# Patient Record
Sex: Female | Born: 1980 | Race: White | Hispanic: No | Smoking: Never smoker
Health system: Southern US, Community
[De-identification: ages and names within clinical notes are randomized; demographics above are authoritative.]

## PROBLEM LIST (undated history)

## (undated) DIAGNOSIS — F419 Anxiety disorder, unspecified: Secondary | ICD-10-CM

## (undated) DIAGNOSIS — T7840XA Allergy, unspecified, initial encounter: Secondary | ICD-10-CM

## (undated) DIAGNOSIS — H7393 Unspecified disorder of tympanic membrane, bilateral: Secondary | ICD-10-CM

## (undated) DIAGNOSIS — T1491XA Suicide attempt, initial encounter: Secondary | ICD-10-CM

## (undated) DIAGNOSIS — F32A Depression, unspecified: Secondary | ICD-10-CM

## (undated) DIAGNOSIS — K219 Gastro-esophageal reflux disease without esophagitis: Secondary | ICD-10-CM

## (undated) DIAGNOSIS — F329 Major depressive disorder, single episode, unspecified: Secondary | ICD-10-CM

## (undated) DIAGNOSIS — R55 Syncope and collapse: Secondary | ICD-10-CM

## (undated) DIAGNOSIS — I10 Essential (primary) hypertension: Secondary | ICD-10-CM

## (undated) DIAGNOSIS — Z8739 Personal history of other diseases of the musculoskeletal system and connective tissue: Secondary | ICD-10-CM

## (undated) DIAGNOSIS — M461 Sacroiliitis, not elsewhere classified: Secondary | ICD-10-CM

## (undated) DIAGNOSIS — L509 Urticaria, unspecified: Secondary | ICD-10-CM

## (undated) DIAGNOSIS — J45909 Unspecified asthma, uncomplicated: Secondary | ICD-10-CM

## (undated) DIAGNOSIS — E162 Hypoglycemia, unspecified: Secondary | ICD-10-CM

## (undated) HISTORY — DX: Hypoglycemia, unspecified: E16.2

## (undated) HISTORY — DX: Gastro-esophageal reflux disease without esophagitis: K21.9

## (undated) HISTORY — DX: Sacroiliitis, not elsewhere classified: M46.1

## (undated) HISTORY — DX: Urticaria, unspecified: L50.9

## (undated) HISTORY — PX: WISDOM TOOTH EXTRACTION: SHX21

## (undated) HISTORY — DX: Syncope and collapse: R55

## (undated) HISTORY — DX: Unspecified disorder of tympanic membrane, bilateral: H73.93

## (undated) HISTORY — PX: ABDOMINAL HYSTERECTOMY: SHX81

## (undated) HISTORY — DX: Allergy, unspecified, initial encounter: T78.40XA

## (undated) HISTORY — DX: Essential (primary) hypertension: I10

## (undated) HISTORY — DX: Personal history of other diseases of the musculoskeletal system and connective tissue: Z87.39

---

## 2000-07-16 ENCOUNTER — Encounter: Admission: RE | Admit: 2000-07-16 | Discharge: 2000-07-16 | Payer: Self-pay

## 2003-10-07 ENCOUNTER — Emergency Department (HOSPITAL_COMMUNITY): Admission: EM | Admit: 2003-10-07 | Discharge: 2003-10-08 | Payer: Self-pay | Admitting: Emergency Medicine

## 2011-10-23 DIAGNOSIS — F419 Anxiety disorder, unspecified: Secondary | ICD-10-CM | POA: Insufficient documentation

## 2015-06-11 ENCOUNTER — Emergency Department (HOSPITAL_COMMUNITY)
Admission: EM | Admit: 2015-06-11 | Discharge: 2015-06-12 | Disposition: A | Discharge status: Other Acute Inpatient Hospital | Payer: Medicaid Other | Attending: Emergency Medicine | Admitting: Emergency Medicine

## 2015-06-11 ENCOUNTER — Ambulatory Visit (HOSPITAL_COMMUNITY)
Admission: RE | Admit: 2015-06-11 | Discharge: 2015-06-11 | Disposition: A | Discharge status: Home / Self Care | Payer: Medicaid Other | Attending: Psychiatry | Admitting: Psychiatry

## 2015-06-11 ENCOUNTER — Encounter (HOSPITAL_COMMUNITY): Payer: Self-pay | Admitting: Oncology

## 2015-06-11 DIAGNOSIS — Z79899 Other long term (current) drug therapy: Secondary | ICD-10-CM | POA: Diagnosis not present

## 2015-06-11 DIAGNOSIS — T50902A Poisoning by unspecified drugs, medicaments and biological substances, intentional self-harm, initial encounter: Secondary | ICD-10-CM | POA: Diagnosis not present

## 2015-06-11 DIAGNOSIS — Z888 Allergy status to other drugs, medicaments and biological substances status: Secondary | ICD-10-CM | POA: Insufficient documentation

## 2015-06-11 DIAGNOSIS — R45851 Suicidal ideations: Secondary | ICD-10-CM | POA: Insufficient documentation

## 2015-06-11 DIAGNOSIS — F329 Major depressive disorder, single episode, unspecified: Secondary | ICD-10-CM | POA: Diagnosis not present

## 2015-06-11 DIAGNOSIS — Z7722 Contact with and (suspected) exposure to environmental tobacco smoke (acute) (chronic): Secondary | ICD-10-CM | POA: Insufficient documentation

## 2015-06-11 DIAGNOSIS — Z9889 Other specified postprocedural states: Secondary | ICD-10-CM | POA: Insufficient documentation

## 2015-06-11 DIAGNOSIS — F419 Anxiety disorder, unspecified: Secondary | ICD-10-CM | POA: Diagnosis not present

## 2015-06-11 DIAGNOSIS — J45909 Unspecified asthma, uncomplicated: Secondary | ICD-10-CM | POA: Diagnosis not present

## 2015-06-11 DIAGNOSIS — R0902 Hypoxemia: Secondary | ICD-10-CM | POA: Insufficient documentation

## 2015-06-11 HISTORY — DX: Depression, unspecified: F32.A

## 2015-06-11 HISTORY — DX: Suicide attempt, initial encounter: T14.91XA

## 2015-06-11 HISTORY — DX: Anxiety disorder, unspecified: F41.9

## 2015-06-11 HISTORY — DX: Major depressive disorder, single episode, unspecified: F32.9

## 2015-06-11 HISTORY — DX: Unspecified asthma, uncomplicated: J45.909

## 2015-06-11 LAB — CBC
HCT: 37.5 % (ref 36.0–46.0)
Hemoglobin: 12.9 g/dL (ref 12.0–15.0)
MCH: 29.6 pg (ref 26.0–34.0)
MCHC: 34.4 g/dL (ref 30.0–36.0)
MCV: 86 fL (ref 78.0–100.0)
Platelets: 325 10*3/uL (ref 150–400)
RBC: 4.36 MIL/uL (ref 3.87–5.11)
RDW: 11.6 % (ref 11.5–15.5)
WBC: 11 10*3/uL — ABNORMAL HIGH (ref 4.0–10.5)

## 2015-06-11 LAB — COMPREHENSIVE METABOLIC PANEL
ALBUMIN: 4.3 g/dL (ref 3.5–5.0)
ALK PHOS: 67 U/L (ref 38–126)
ALT: 18 U/L (ref 14–54)
ANION GAP: 4 — AB (ref 5–15)
AST: 21 U/L (ref 15–41)
BILIRUBIN TOTAL: 0.4 mg/dL (ref 0.3–1.2)
BUN: 11 mg/dL (ref 6–20)
CALCIUM: 9.2 mg/dL (ref 8.9–10.3)
CO2: 27 mmol/L (ref 22–32)
Chloride: 109 mmol/L (ref 101–111)
Creatinine, Ser: 0.91 mg/dL (ref 0.44–1.00)
GFR calc Af Amer: >60 mL/min (ref >60)
GFR calc non Af Amer: >60 mL/min (ref >60)
GLUCOSE: 91 mg/dL (ref 65–99)
Potassium: 3.9 mmol/L (ref 3.5–5.1)
Sodium: 140 mmol/L (ref 135–145)
TOTAL PROTEIN: 7.7 g/dL (ref 6.5–8.1)

## 2015-06-11 LAB — SALICYLATE LEVEL: Salicylate Lvl: <4 mg/dL (ref 2.8–30.0)

## 2015-06-11 LAB — CBG MONITORING, ED: Glucose-Capillary: 78 mg/dL (ref 65–99)

## 2015-06-11 LAB — ETHANOL: Alcohol, Ethyl (B): <5 mg/dL (ref <5)

## 2015-06-11 LAB — ACETAMINOPHEN LEVEL: Acetaminophen (Tylenol), Serum: <10 ug/mL — ABNORMAL LOW (ref 10–30)

## 2015-06-11 MED ORDER — TETANUS-DIPHTH-ACELL PERTUSSIS 5-2.5-18.5 LF-MCG/0.5 IM SUSP
0.5000 mL | Freq: Once | INTRAMUSCULAR | Status: AC
  Administered 2015-06-11: 0.5 mL via INTRAMUSCULAR
  Filled 2015-06-11: qty 0.5

## 2015-06-11 NOTE — ED Provider Notes (Signed)
CSN: [id number]     Arrival date & time 06/11/15  1943 History   First MD Initiated Contact with Patient 06/11/15 2046     Chief Complaint  Patient presents with  . Ingestion  . Suicidal     (Consider location/radiation/quality/duration/timing/severity/associated sxs/prior Treatment) The history is provided by the patient and medical records. No language interpreter was used.     Tara Schmidt) is a 35 y.o. female  with a hx of Suicide attempt, depression, anxiety, asthma presents to the Emergency Department complaining of gradual, persistent, progressively worsening Suicidal ideations onset approximately 3 days ago. Patient's husband at bedside reports that she was cutting her wrists on Friday. She was evaluated by [facility] at that time and discharged home. He reports that yesterday she took a handful of clonazepam and was again evaluated by [facility] and discharged home. Today she continued to endorse suicidal ideation and took an additional handful of clonazepam as well as a handful of Vistaril. She states that she feels suicidal. Patient has been reports that she has been having custody problems with her ex-husband over her daughter and this seems to have prompted this episode of suicidal ideation. She has a long history of suicide attempt depression and anxiety. Patient has previously been hospitalized for same. Patient reports she feels sleepy but has no other somatic complaints. No nausea or vomiting, no chest pain or shortness of breath.     Past Medical History  Diagnosis Date  . Suicide attempt ([place])     x 3  . Depression   . Anxiety   . Asthma    Past Surgical History  Procedure Laterality Date  . Abdominal hysterectomy     No family history on file. Social History  Substance Use Topics  . Smoking status: Passive Smoke Exposure - Never Smoker  . Smokeless tobacco: Never Used  . Alcohol Use: Yes   OB History    No data available     Review of  Systems  Constitutional: Negative for fever, diaphoresis, appetite change, fatigue and unexpected weight change.  HENT: Negative for mouth sores.   Eyes: Negative for visual disturbance.  Respiratory: Negative for cough, chest tightness, shortness of breath and wheezing.   Cardiovascular: Negative for chest pain.  Gastrointestinal: Negative for nausea, vomiting, abdominal pain, diarrhea and constipation.  Endocrine: Negative for polydipsia, polyphagia and polyuria.  Genitourinary: Negative for dysuria, urgency, frequency and hematuria.  Musculoskeletal: Negative for back pain and neck stiffness.  Skin: Negative for rash.  Allergic/Immunologic: Negative for immunocompromised state.  Neurological: Negative for syncope, light-headedness and headaches.  Hematological: Does not bruise/bleed easily.  Psychiatric/Behavioral: Positive for suicidal ideas, self-injury and dysphoric mood. Negative for sleep disturbance. The patient is not nervous/anxious.       Allergies  Effexor; Latex; Zoloft; and Adhesive  Home Medications   Prior to Admission medications   Medication Sig Start Date End Date Taking? Authorizing Provider  albuterol (PROVENTIL HFA;VENTOLIN HFA) 108 (90 Base) MCG/ACT inhaler Inhale 1-2 puffs into the lungs every 6 (six) hours as needed for wheezing or shortness of breath.   Yes Historical Provider, MD  cetirizine (ZYRTEC) 10 MG tablet Take 10 mg by mouth daily.   Yes Historical Provider, MD  clonazePAM (KLONOPIN) 1 MG tablet Take 1 mg by mouth daily as needed for anxiety.    Yes Historical Provider, MD  hydrOXYzine (VISTARIL) 25 MG capsule Take 25 mg by mouth 3 (three) times daily as needed for anxiety.   Yes Historical  Provider, MD  hydrOXYzine (VISTARIL) 50 MG capsule Take 50 mg by mouth 3 (three) times daily as needed for anxiety.   Yes Historical Provider, MD  mometasone-formoterol (DULERA) 100-5 MCG/ACT AERO Inhale 1 puff into the lungs 2 (two) times daily.   Yes Historical  Provider, MD   BP 102/80 mmHg  Pulse 79  Temp(Src) 98.7 F (37.1 C) (Oral)  Resp 23  Ht 5\' 1"  (1.549 m)  Wt 60.328 kg  BMI 25.14 kg/m2  SpO2 97% Physical Exam  Constitutional: She appears well-developed and well-nourished. No distress.  Patient is drowsy but awakens to verbal stimuli  HENT:  Head: Normocephalic and atraumatic.  Mouth/Throat: Oropharynx is clear and moist. No oropharyngeal exudate.  Eyes: Conjunctivae are normal. No scleral icterus.  Neck: Normal range of motion. Neck supple.  Cardiovascular: Normal rate, regular rhythm, normal heart sounds and intact distal pulses.   No murmur heard. No tachycardia  Pulmonary/Chest: Effort normal and breath sounds normal. No respiratory distress. She has no wheezes.  Equal chest expansion  Abdominal: Soft. Bowel sounds are normal. She exhibits no mass. There is no tenderness. There is no rebound and no guarding.  Soft and nontender  Musculoskeletal: Normal range of motion. She exhibits no edema.  Neurological: She is alert.  Speech is clear and goal oriented Moves extremities without ataxia  Skin: Skin is warm and dry. She is not diaphoretic.  Psychiatric: She has a normal mood and affect.  Nursing note and vitals reviewed.   ED Course  Procedures (including critical care time) Labs Review Labs Reviewed  COMPREHENSIVE METABOLIC PANEL - Abnormal; Notable for the following:    Anion gap 4 (*)    All other components within normal limits  ACETAMINOPHEN LEVEL - Abnormal; Notable for the following:    Acetaminophen (Tylenol), Serum <10 (*)    All other components within normal limits  CBC - Abnormal; Notable for the following:    WBC 11.0 (*)    All other components within normal limits  ETHANOL  SALICYLATE LEVEL  URINE RAPID DRUG SCREEN, HOSP PERFORMED  URINALYSIS, ROUTINE W REFLEX MICROSCOPIC (NOT AT [facility])  CBG MONITORING, ED       EKG Interpretation   Date/Time:  Sunday June 11 2015 20:12:24 EDT Ventricular  Rate:  84 PR Interval:  138 QRS Duration: 83 QT Interval:  370 QTC Calculation: 437 R Axis:   55 Text Interpretation:  Sinus rhythm Normal QT interval, No delta waves No  previous ECGs available Confirmed by NGUYEN, EMILY ([id number]) on 06/11/2015  11:23:08 PM      EKG Interpretation  Date/Time:  Monday June 12 2015 00:06:49 EDT Ventricular Rate:  88 PR Interval:  129 QRS Duration: 82 QT Interval:  384 QTC Calculation: 465 R Axis:   46 Text Interpretation:  Sinus rhythm Normal QT interval, unchanged from previous ECG tonight      MDM   Final diagnoses:  Suicidal ideation  Overdose, intentional self-harm, initial encounter ([place])   Tara Schmidt) presents with suicidal ideations and suicide attempt by overdose. Discussed with poison control who recommends 4-6 hours of monitoring looking for tachycardia, QRS widening or seizures. Patient initially hypoxic in the low 90s and placed on 2 L of oxygen. Patient removed this herself and has had no further episodes of hypoxia. She remains alert to verbal stimuli.  12:12 AM Labs reassuring.  Repeat ECG without prolonged QT.  Pt is sleeping.  No further hypoxia.  Psych hold placed.    BP 102/80  mmHg  Pulse 79  Temp(Src) 98.7 F (37.1 C) (Oral)  Resp 23  Ht 5\' 1"  (1.549 m)  Wt 60.328 kg  BMI 25.14 kg/m2  SpO2 97%   Abigail Butts, PA-C 06/12/15 0022  Harvel Quale, MD 06/14/15 [phone]

## 2015-06-11 NOTE — ED Notes (Signed)
Per pt she took a "handful" of clonazepam last night and went to [facility] where she was d/c'd.  Pt reports her clonazepam is 1 mg and her vistaril is 50 mg.  Pt cannot tell this writer how many of each she had taken.

## 2015-06-11 NOTE — ED Notes (Signed)
Pt remains in sight of nurse's station.

## 2015-06-11 NOTE — BH Assessment (Signed)
Started to assess Pt. Pt and Pt's husband stated Pt had ingested an unknown quantity of Klonopin and Vistaril approximately forty five minutes prior to arriving at [facility]. Earlier today Pt superficially cut her wrist and tied a cord around her neck in a suicide attempt. Assessment was stopped, AC notified and 911 called. Contacted Anderson Malta, AC at [facility], and notified of situation.   Orpah Greek Anson Fret, LPC, [facility], [facility] Triage Specialist [phone]

## 2015-06-11 NOTE — ED Notes (Signed)
Pt wanded by security. 

## 2015-06-11 NOTE — ED Notes (Signed)
Per EMS pt intentionally took a handful of vistaril and clonazepam.  Total amount taken is unknown.  Pt reported to EMS that this was a suicide attempt.  Per EMS pt has superficial lacerations to left wrist as well.

## 2015-06-11 NOTE — ED Notes (Signed)
Per pt's husband pt has 2 bottles of clonazepam, one w/ 30 tablets and 1 w/ 20 tablets.  Pt's husband reports he believes he flushed approximately 15 tablets.

## 2015-06-11 NOTE — ED Notes (Signed)
Pt's belongings bagged, labeled and placed behind nurse's station. One bag of belongings (clothing, iphone, shoes) given to husband as per pt request.

## 2015-06-12 ENCOUNTER — Inpatient Hospital Stay (HOSPITAL_COMMUNITY)
Admission: AD | Admit: 2015-06-12 | Discharge: 2015-06-16 | DRG: 885 | Disposition: A | Discharge status: Home / Self Care | Payer: Medicaid Other | Source: Intra-hospital | Attending: Psychiatry | Admitting: Psychiatry

## 2015-06-12 ENCOUNTER — Encounter (HOSPITAL_COMMUNITY): Payer: Self-pay

## 2015-06-12 DIAGNOSIS — F332 Major depressive disorder, recurrent severe without psychotic features: Principal | ICD-10-CM | POA: Insufficient documentation

## 2015-06-12 DIAGNOSIS — Z915 Personal history of self-harm: Secondary | ICD-10-CM

## 2015-06-12 DIAGNOSIS — T424X2A Poisoning by benzodiazepines, intentional self-harm, initial encounter: Secondary | ICD-10-CM | POA: Diagnosis not present

## 2015-06-12 DIAGNOSIS — R45851 Suicidal ideations: Secondary | ICD-10-CM

## 2015-06-12 DIAGNOSIS — F329 Major depressive disorder, single episode, unspecified: Secondary | ICD-10-CM

## 2015-06-12 DIAGNOSIS — G4700 Insomnia, unspecified: Secondary | ICD-10-CM | POA: Diagnosis present

## 2015-06-12 DIAGNOSIS — T1491 Suicide attempt: Secondary | ICD-10-CM | POA: Diagnosis not present

## 2015-06-12 LAB — RAPID URINE DRUG SCREEN, HOSP PERFORMED
AMPHETAMINES: NOT DETECTED
BENZODIAZEPINES: POSITIVE — AB
Barbiturates: NOT DETECTED
Cocaine: NOT DETECTED
OPIATES: NOT DETECTED
Tetrahydrocannabinol: NOT DETECTED

## 2015-06-12 LAB — URINALYSIS, ROUTINE W REFLEX MICROSCOPIC
BILIRUBIN URINE: NEGATIVE
GLUCOSE, UA: NEGATIVE mg/dL
HGB URINE DIPSTICK: NEGATIVE
Ketones, ur: NEGATIVE mg/dL
Leukocytes, UA: NEGATIVE
Nitrite: NEGATIVE
PH: 6 (ref 5.0–8.0)
Protein, ur: NEGATIVE mg/dL
SPECIFIC GRAVITY, URINE: 1.025 (ref 1.005–1.030)

## 2015-06-12 MED ORDER — ONDANSETRON HCL 4 MG PO TABS: 4.0000 mg | ORAL_TABLET | Freq: Three times a day (TID) | ORAL | Status: DC | PRN

## 2015-06-12 MED ORDER — LOPERAMIDE HCL 2 MG PO CAPS
2.0000 mg | ORAL_CAPSULE | ORAL | Status: DC | PRN
  Administered 2015-06-12: 2 mg via ORAL
  Filled 2015-06-12: qty 1

## 2015-06-12 MED ORDER — LORAZEPAM 1 MG PO TABS
1.0000 mg | ORAL_TABLET | Freq: Four times a day (QID) | ORAL | Status: DC | PRN
  Administered 2015-06-12 – 2015-06-13 (×3): 1 mg via ORAL
  Filled 2015-06-12 (×3): qty 1

## 2015-06-12 MED ORDER — ZOLPIDEM TARTRATE 5 MG PO TABS: 5.0000 mg | ORAL_TABLET | Freq: Every evening | ORAL | Status: DC | PRN

## 2015-06-12 MED ORDER — LORAZEPAM 1 MG PO TABS: 1.0000 mg | ORAL_TABLET | Freq: Three times a day (TID) | ORAL | Status: DC | PRN

## 2015-06-12 MED ORDER — ALUM & MAG HYDROXIDE-SIMETH 200-200-20 MG/5ML PO SUSP: 30.0000 mL | ORAL | Status: DC | PRN

## 2015-06-12 MED ORDER — ALBUTEROL SULFATE HFA 108 (90 BASE) MCG/ACT IN AERS: 1.0000 | INHALATION_SPRAY | Freq: Four times a day (QID) | RESPIRATORY_TRACT | Status: DC | PRN

## 2015-06-12 MED ORDER — IBUPROFEN 200 MG PO TABS
600.0000 mg | ORAL_TABLET | Freq: Three times a day (TID) | ORAL | Status: DC | PRN
Start: 2015-06-12 — End: 2015-06-12
  Administered 2015-06-12: 600 mg via ORAL
  Filled 2015-06-12: qty 3

## 2015-06-12 MED ORDER — MAGNESIUM HYDROXIDE 400 MG/5ML PO SUSP: 30.0000 mL | Freq: Every day | ORAL | Status: DC | PRN

## 2015-06-12 MED ORDER — MOMETASONE FURO-FORMOTEROL FUM 100-5 MCG/ACT IN AERO
1.0000 | INHALATION_SPRAY | Freq: Two times a day (BID) | RESPIRATORY_TRACT | Status: DC
  Administered 2015-06-12: 1 via RESPIRATORY_TRACT
  Filled 2015-06-12: qty 8.8

## 2015-06-12 MED ORDER — LORATADINE 10 MG PO TABS
10.0000 mg | ORAL_TABLET | Freq: Every day | ORAL | Status: DC
  Administered 2015-06-12: 10 mg via ORAL
  Filled 2015-06-12: qty 1

## 2015-06-12 MED ORDER — ACETAMINOPHEN 325 MG PO TABS
650.0000 mg | ORAL_TABLET | Freq: Four times a day (QID) | ORAL | Status: DC | PRN
  Administered 2015-06-12 – 2015-06-15 (×2): 650 mg via ORAL
  Filled 2015-06-12 (×2): qty 2

## 2015-06-12 MED ORDER — NICOTINE 21 MG/24HR TD PT24: 21.0000 mg | MEDICATED_PATCH | Freq: Every day | TRANSDERMAL | Status: DC

## 2015-06-12 MED ORDER — PNEUMOCOCCAL VAC POLYVALENT 25 MCG/0.5ML IJ INJ
0.5000 mL | INJECTION | INTRAMUSCULAR | Status: AC
  Administered 2015-06-16: 0.5 mL via INTRAMUSCULAR

## 2015-06-12 MED ORDER — TRAZODONE HCL 50 MG PO TABS
50.0000 mg | ORAL_TABLET | Freq: Every evening | ORAL | Status: DC | PRN
  Administered 2015-06-12: 50 mg via ORAL
  Filled 2015-06-12 (×7): qty 1

## 2015-06-12 NOTE — Progress Notes (Signed)
D: Pt has depressed affect and depressed, anxious mood.  She reports her day "sucks."  Pt reports her goal is to "sleep."  Pt reports she had a good visit with her husband tonight.  Pt reports passive SI without a plan.  She verbally contracts for safety.  She denies HI, denies hallucinations, reports pain from headache of 8/10.  Pt complains of diarrhea.  Pt has been visible in milieu interacting with peers and staff appropriately.  Pt attended evening group.   A: Introduced self to pt.  Met with pt and offered support and encouragement.  Actively listened to pt.  On-site provider notified of pt's complaint of diarrhea.  PRN medication administered for diarrhea, pain, anxiety.  Medications administered per order.   R: Pt is compliant with medications.  Pt verbally contracts for safety.  Will continue to monitor and assess.

## 2015-06-12 NOTE — Consult Note (Signed)
[place] [facility] Psychiatry Consult   Reason for Consult:  Suicidal Attempt Referring Physician:  EPD Patient Identification: Tara Schmidt) MRN:  [id number] Principal Diagnosis: MDD (major depressive disorder) [facility]) Diagnosis:   Patient Active Problem List   Diagnosis Date Noted  . MDD (major depressive disorder) ([place]) [F32.9] 06/12/2015    Total Time spent with patient: 45 minutes  Subjective:   Tara Schmidt) is a 35 y.o. female patient admitted with suicidal attempt. Tara Schmidt) is awake, alert and oriented X3 , found resting in bedroom. Patient reports suicidal ideation with past attempts. Reports past dx of Bipolar and depression. Reports she is she followed by [facility] [place], however states she doesn't like to attend group theory/treatment due to social anxiety states that she doesn't like the people there. Patient validates information provided on tele-assessment. Denies homicidal ideation. Denies auditory or visual hallucination and does not appear to be responding to internal stimuli. States her depression 10/10. Support, encouragement and reassurance was provided.   HPI: PerTele- Assessment Note-Tara Schmidt) is an 35 y.o. female who presents to [facility] ED accompanied by her husband, who did not participate in assessment. Pt has a history of depression and anxiety. She reports she has been "very suicidal" for the past three days. She reports she superficially cut her wrists two days ago and was evaluated by staff at [facility] and discharged home. She reports yesterday she took a handful of clonazepam and again was evaluated and discharged home. She states today she took an unknown quantity of clonazepam and Vistaril in a suicide attempt. She was brought by her husband to [facility] for evaluation. Husband told [facility] staff that Pt also put a cord around her neck in an attempt to strangle herself. Pt was taken to [facility] via EMS for medical  clearance and further assessment.  Pt continues to endorse suicidal ideation. Pt reports symptoms including crying spells, social withdrawal, loss of interest in usual pleasures, fatigue, irritability, decreased concentration, decreased sleep, decreased appetite and feelings of guilt and hopelessness. Pt says she has difficulty sleeping even when she takes Vistaril. She reports one previous suicide attempt in 2014 when she overdosed on Xanax and was psychiatrically hospitalized at [facility]. She reports her anxiety "is through the roof" and reports daily panic attacks. She denies homicidal ideation or history of violence. She denies any history of psychotic symptoms. She denies abuse of alcohol or any substance abuse.  Pt identifies custody issues with her 77 year old daughter as her primary stressor. She states there is no legal custody agreement. She says her daughter went to stay with her father and wants to return to live with Pt but daughter's father is refusing to allow daughter to return. Pt lives with her husband and stepson. She has three sons, ages 64, 45 and 6, who live with their father. Pt says she would on an Alzheimer's unit and that her job can be stressful. Pt does not have a current psychiatrist or therapist and says she gets her clonopin and Vistaril form her primary care physician.  Past Psychiatric History: See Above  Risk to Self:   Risk to Others:   Prior Inpatient Therapy:   Prior Outpatient Therapy:    Past Medical History:  Past Medical History  Diagnosis Date  . Suicide attempt ([place])     x 3  . Depression   . Anxiety   . Asthma     Past Surgical History  Procedure  Laterality Date  . Abdominal hysterectomy     Family History: History reviewed. No pertinent family history. Family Psychiatric  History: See Above Social History:  History  Alcohol Use  . Yes     History  Drug Use No    Social History   Social History   . Marital Status: Married    Spouse Name: N/A  . Number of Children: N/A  . Years of Education: N/A   Social History Main Topics  . Smoking status: Passive Smoke Exposure - Never Smoker  . Smokeless tobacco: Never Used  . Alcohol Use: Yes  . Drug Use: No  . Sexual Activity: Yes    Birth Control/ Protection: Surgical   Other Topics Concern  . None   Social History Narrative   Additional Social History:    Allergies:   Allergies  Allergen Reactions  . Effexor [Venlafaxine] Hives  . Latex Hives  . Zoloft [Sertraline Hcl] Hives  . Adhesive [Tape] Hives and Rash    Labs:  Results for orders placed or performed during the hospital encounter of 06/11/15 (from the past 48 hour(s))  Comprehensive metabolic panel     Status: Abnormal   Collection Time: 06/11/15  8:27 PM  Result Value Ref Range   Sodium 140 135 - 145 mmol/L   Potassium 3.9 3.5 - 5.1 mmol/L   Chloride 109 101 - 111 mmol/L   CO2 27 22 - 32 mmol/L   Glucose, Bld 91 65 - 99 mg/dL   Schmidt 11 6 - 20 mg/dL   Creatinine, Ser 0.91 0.44 - 1.00 mg/dL   Calcium 9.2 8.9 - 10.3 mg/dL   Total Protein 7.7 6.5 - 8.1 g/dL   Albumin 4.3 3.5 - 5.0 g/dL   AST 21 15 - 41 U/L   ALT 18 14 - 54 U/L   Alkaline Phosphatase 67 38 - 126 U/L   Total Bilirubin 0.4 0.3 - 1.2 mg/dL   GFR calc non Af Amer >60 >60 mL/min   GFR calc Af Amer >60 >60 mL/min    Comment: (NOTE) The eGFR has been calculated using the CKD EPI equation. This calculation has not been validated in all clinical situations. eGFR's persistently <60 mL/min signify possible Chronic Kidney Disease.    Anion gap 4 (L) 5 - 15  Ethanol (ETOH)     Status: None   Collection Time: 06/11/15  8:27 PM  Result Value Ref Range   Alcohol, Ethyl (B) <5 <5 mg/dL    Comment:        LOWEST DETECTABLE LIMIT FOR SERUM ALCOHOL IS 5 mg/dL FOR MEDICAL PURPOSES ONLY   Salicylate level     Status: None   Collection Time: 06/11/15  8:27 PM  Result Value Ref Range   Salicylate Lvl  <9.0 2.8 - 30.0 mg/dL  Acetaminophen level     Status: Abnormal   Collection Time: 06/11/15  8:27 PM  Result Value Ref Range   Acetaminophen (Tylenol), Serum <10 (L) 10 - 30 ug/mL    Comment:        THERAPEUTIC CONCENTRATIONS VARY SIGNIFICANTLY. A RANGE OF 10-30 ug/mL MAY BE AN EFFECTIVE CONCENTRATION FOR MANY PATIENTS. HOWEVER, SOME ARE BEST TREATED AT CONCENTRATIONS OUTSIDE THIS RANGE. ACETAMINOPHEN CONCENTRATIONS >150 ug/mL AT 4 HOURS AFTER INGESTION AND >50 ug/mL AT 12 HOURS AFTER INGESTION ARE OFTEN ASSOCIATED WITH TOXIC REACTIONS.   CBC     Status: Abnormal   Collection Time: 06/11/15  8:27 PM  Result Value Ref Range  WBC 11.0 (H) 4.0 - 10.5 K/uL   RBC 4.36 3.87 - 5.11 MIL/uL   Hemoglobin 12.9 12.0 - 15.0 g/dL   HCT 37.5 36.0 - 46.0 %   MCV 86.0 78.0 - 100.0 fL   MCH 29.6 26.0 - 34.0 pg   MCHC 34.4 30.0 - 36.0 g/dL   RDW 11.6 11.5 - 15.5 %   Platelets 325 150 - 400 K/uL  CBG monitoring, ED     Status: None   Collection Time: 06/11/15  8:51 PM  Result Value Ref Range   Glucose-Capillary 78 65 - 99 mg/dL  Urine rapid drug screen (hosp performed) (Not at [facility])     Status: Abnormal   Collection Time: 06/12/15  1:21 AM  Result Value Ref Range   Opiates NONE DETECTED NONE DETECTED   Cocaine NONE DETECTED NONE DETECTED   Benzodiazepines POSITIVE (A) NONE DETECTED   Amphetamines NONE DETECTED NONE DETECTED   Tetrahydrocannabinol NONE DETECTED NONE DETECTED   Barbiturates NONE DETECTED NONE DETECTED    Comment:        DRUG SCREEN FOR MEDICAL PURPOSES ONLY.  IF CONFIRMATION IS NEEDED FOR ANY PURPOSE, NOTIFY LAB WITHIN 5 DAYS.        LOWEST DETECTABLE LIMITS FOR URINE DRUG SCREEN Drug Class       Cutoff (ng/mL) Amphetamine      1000 Barbiturate      200 Benzodiazepine   086 Tricyclics       578 Opiates          300 Cocaine          300 THC              50   Urinalysis, Routine w reflex microscopic (not at [facility])     Status: Abnormal   Collection Time:  06/12/15  1:21 AM  Result Value Ref Range   Color, Urine YELLOW YELLOW   APPearance CLOUDY (A) CLEAR   Specific Gravity, Urine 1.025 1.005 - 1.030   pH 6.0 5.0 - 8.0   Glucose, UA NEGATIVE NEGATIVE mg/dL   Hgb urine dipstick NEGATIVE NEGATIVE   Bilirubin Urine NEGATIVE NEGATIVE   Ketones, ur NEGATIVE NEGATIVE mg/dL   Protein, ur NEGATIVE NEGATIVE mg/dL   Nitrite NEGATIVE NEGATIVE   Leukocytes, UA NEGATIVE NEGATIVE    Comment: MICROSCOPIC NOT DONE ON URINES WITH NEGATIVE PROTEIN, BLOOD, LEUKOCYTES, NITRITE, OR GLUCOSE <1000 mg/dL.    Current Facility-Administered Medications  Medication Dose Route Frequency Provider Last Rate Last Dose  . acetaminophen (TYLENOL) tablet 650 mg  650 mg Oral Q6H PRN Derrill Center, NP      . alum & mag hydroxide-simeth (MAALOX/MYLANTA) 200-200-20 MG/5ML suspension 30 mL  30 mL Oral Q4H PRN Derrill Center, NP      . LORazepam (ATIVAN) tablet 1 mg  1 mg Oral Q6H PRN Kerrie Buffalo, NP      . magnesium hydroxide (MILK OF MAGNESIA) suspension 30 mL  30 mL Oral Daily PRN Derrill Center, NP        Musculoskeletal: Strength & Muscle Tone: within normal limits Gait & Station: normal Patient leans: N/A  Psychiatric Specialty Exam: Review of Systems  Psychiatric/Behavioral: Positive for depression, suicidal ideas and substance abuse. The patient is nervous/anxious and has insomnia.   All other systems reviewed and are negative.   Blood pressure 120/81, pulse 98, temperature 98.3 F (36.8 C), temperature source Oral, resp. rate 16, height 5' 0.5" (1.537 m), weight 58.968 kg (130 lb), last menstrual  period 02/19/2007, SpO2 100 %.Body mass index is 24.96 kg/(m^2).  General Appearance: Guarded  Eye Contact::  Fair  Speech:  Clear and Coherent  Volume:  Decreased  Mood:  Depressed  Affect:  Blunt, Depressed and Flat  Thought Process:  Intact  Orientation:  Full (Time, Place, and Person)  Thought Content:  Hallucinations: None  Suicidal Thoughts:  Yes.  with  intent/plan  Homicidal Thoughts:  No  Memory:  Immediate;   Fair Recent;   Fair Remote;   Fair  Judgement:  Intact  Insight:  Lacking  Psychomotor Activity:  Restlessness  Concentration:  Fair  Recall:  [employer] of Knowledge:Fair  Language: Good  Akathisia:  No  Handed:  Right  AIMS (if indicated):     Assets:  Desire for Improvement Resilience  ADL's:  Intact  Cognition: WNL  Sleep:        I agree with current treatment plan on 06/12/2015, Patient seen face-to-face for psychiatric evaluation follow-up, chart reviewed and case discussed with the MD Athira Janowicz, Advanced Practice Provider and Treatment team. Reviewed the information documented and agree with the treatment plan.   Treatment Plan Summary: Daily contact with patient to assess and evaluate symptoms and progress in treatment and Medication management Crisis stabilization- continue CIWAA/Ativan protocol     Disposition: Recommend psychiatric Inpatient admission when medically cleared. -Patient accepted to [address] Homans, NP 06/12/2015  Patient seen face-to-face for psychiatric evaluation, chart reviewed and case discussed with the physician extender and developed treatment plan. Reviewed the information documented and agree with the treatment plan. Corena Pilgrim, MD

## 2015-06-12 NOTE — Tx Team (Signed)
Initial Interdisciplinary Treatment Plan   PATIENT STRESSORS: Marital or family conflict Medication change or noncompliance   PATIENT STRENGTHS: Capable of independent living Communication skills Physical Health Supportive family/friends   PROBLEM LIST: Problem List/Patient Goals Date to be addressed Date deferred Reason deferred Estimated date of resolution  Medication stabilization 0000000     Family conflict 0000000     Depression 06/12/2015     Suicidal Ideation 06/12/2015                                    DISCHARGE CRITERIA:  Improved stabilization in mood, thinking, and/or behavior Motivation to continue treatment in a less acute level of care Need for constant or close observation no longer present Verbal commitment to aftercare and medication compliance  PRELIMINARY DISCHARGE PLAN: Outpatient therapy Return to previous living arrangement Return to previous work or school arrangements  PATIENT/FAMIILY INVOLVEMENT: This treatment plan has been presented to and reviewed with the patient, Tara Schmidt).  The patient and family have been given the opportunity to ask questions and make suggestions.  Tara Schmidt 06/12/2015, 5:58 PM

## 2015-06-12 NOTE — Progress Notes (Signed)
Adult Psychoeducational Group Note  Date:  06/12/2015 Time:  9:34 PM  Group Topic/Focus:  Wrap-Up Group:   The focus of this group is to help patients review their daily goal of treatment and discuss progress on daily workbooks.  Participation Level:  Active  Participation Quality:  Appropriate and Attentive  Affect:  Appropriate  Cognitive:  Appropriate  Insight: Appropriate and Good  Engagement in Group:  Engaged  Modes of Intervention:  Discussion  Additional Comments:  Pt goal is to get some rest and quit worrying so much.   Tara Schmidt 06/12/2015, 9:34 PM

## 2015-06-12 NOTE — Progress Notes (Signed)
Female visitor entered pt room as CM left out CM checked with ED RN about pt lunch tray as requested

## 2015-06-12 NOTE — ED Notes (Signed)
Bed: WA15 Expected date:  Expected time:  Means of arrival:  Comments: RES B 

## 2015-06-12 NOTE — Progress Notes (Signed)
Patient arrived on unit.  She requested a 72-hour discharge and was given the form to fill out.  Patient signed 4.24 at 16.  Explained process of 72 hour.  Patient was asked if she was suicidal and she stated, "I will be in here."  Asked patient if she could contract for safety and she stated, "there's nothing I can do in here."  Patient was given a dinner tray and she brought it up to nurses station.  She stated, "I don't feel like it's a good idea for me to have a fork" and then proceeded to show me superficial cuts bilateral arms.  Patient contracts for safety at this time.

## 2015-06-12 NOTE — ED Notes (Signed)
Pt gave this RN permission to speak w/ her husband.  Husband informed that Pt was moved to room 15 and verbalized understanding of [facility] Visitor Guidelines.

## 2015-06-12 NOTE — ED Notes (Signed)
Pt and husband informed that husband cannot be a part of intake process and visiting hours start at 6:30p.  Also, informed that clothing cannot have strings and personal products cannot have alcohol as one of the first 3 ingredients.  Pt and husband verbalized understanding.

## 2015-06-12 NOTE — Progress Notes (Signed)
Pt confirms with ED CM she sees Dortha Kern PA in [place] EPIC updated

## 2015-06-12 NOTE — BH Assessment (Addendum)
Tele Assessment Note   Tara Schmidt) is an 35 y.o. female who presents to [facility] ED accompanied by her Tara Schmidt, who did not participate in assessment. Tara Schmidt has a history of depression and anxiety. She reports she has been "very suicidal" for the past three days. She reports she superficially cut her wrists two days ago and was evaluated by Schmidt at [facility] and discharged home. She reports yesterday she took a handful of clonazepam and again was evaluated and discharged home. She states today she took an unknown quantity of clonazepam and Vistaril in a suicide attempt. She was brought by her Tara Schmidt to [facility] for evaluation. Tara Schmidt told Tara Schmidt that Tara Schmidt also put a cord around her neck in an attempt to strangle herself. Tara Schmidt was taken to [facility] via EMS for medical clearance and further assessment.  Tara Schmidt continues to endorse suicidal ideation. Tara Schmidt reports symptoms including crying spells, social withdrawal, loss of interest in usual pleasures, fatigue, irritability, decreased concentration, decreased sleep, decreased appetite and feelings of guilt and hopelessness. Tara Schmidt says she has difficulty sleeping even when she takes Vistaril. She reports one previous suicide attempt in 2014 when she overdosed on Xanax and was psychiatrically hospitalized at [facility]. She reports her anxiety "is through the roof" and reports daily panic attacks. She denies homicidal ideation or history of violence. She denies any history of psychotic symptoms. She denies abuse of alcohol or any substance abuse.  Tara Schmidt identifies custody issues with her 31 year old daughter as her primary stressor. She states there is no legal custody agreement. She says her daughter went to stay with her father and wants to return to live with Tara Schmidt but daughter's father is refusing to allow daughter to return. Tara Schmidt lives with her Tara Schmidt and stepson. She has three sons, ages 36, 8 and 70, who live with their  father. Tara Schmidt says she would on an Alzheimer's unit and that her job can be stressful. Tara Schmidt does not have a current psychiatrist or therapist and says she gets her clonopin and Vistaril form her primary care physician.  Tara Schmidt is dressed in hospital scrubs, drowsy, oriented x4 with soft, slow speech and normal motor behavior. Eye contact is fair. Tara Schmidt's mood is depressed and anxious; affect is depressed. Thought process is coherent and relevant. There is no indication Tara Schmidt is currently responding to internal stimuli or experiencing delusional thought content. Tara Schmidt was cooperative throughout assessment. She is willing to sign voluntarily into a psychiatric facility.   Diagnosis: Major Depressive Disorder, Recurrent, Severe Without Psychotic Features; Unspecified Anxiety Disorder  Past Medical History:  Past Medical History  Diagnosis Date  . Suicide attempt ([place])     x 3  . Depression   . Anxiety   . Asthma     Past Surgical History  Procedure Laterality Date  . Abdominal hysterectomy      Family History: No family history on file.  Social History:  reports that she has been passively smoking.  She has never used smokeless tobacco. She reports that she drinks alcohol. She reports that she does not use illicit drugs.  Additional Social History:  Alcohol / Drug Use Pain Medications: Denies abuse Prescriptions: Denies abuse Over the Counter: Denies abuse History of alcohol / drug use?: No history of alcohol / drug abuse Longest period of sobriety (when/how long): NA  CIWA: CIWA-Ar BP: 92/65 mmHg Pulse Rate: 92 COWS:    PATIENT STRENGTHS: (choose at least two) Ability for insight Average or above  average intelligence Capable of independent living Occupational psychologist fund of knowledge Motivation for treatment/growth Physical Health Supportive family/friends Work skills  Allergies:  Allergies  Allergen Reactions  . Effexor [Venlafaxine] Hives  . Latex Hives   . Zoloft [Sertraline Hcl] Hives  . Adhesive [Tape] Hives and Rash    Home Medications:  (Not in a hospital admission)  OB/GYN Status:  No LMP recorded. Patient has had a hysterectomy.  General Assessment Data Location of Assessment: [facility] TTS Assessment: In system Is this a Tele or Face-to-Face Assessment?: Tele Assessment Is this an Initial Assessment or a Re-assessment for this encounter?: Initial Assessment Marital status: Married [place] name: Raczka Is patient pregnant?: No Pregnancy Status: No Living Arrangements: Spouse/significant other, Children Can Tara Schmidt return to current living arrangement?: Yes Admission Status: Voluntary Is patient capable of signing voluntary admission?: Yes Referral Source: Self/Family/Friend Insurance type: Medicaid     Crisis Care Plan Living Arrangements: Spouse/significant other, Children Legal Guardian: Other: (None) Name of Psychiatrist: None Name of Therapist: None  Education Status Is patient currently in school?: No Current Grade: NA Highest grade of school patient has completed: GED Name of school: NA Contact person: NA  Risk to self with the past 6 months Suicidal Ideation: Yes-Currently Present Has patient been a risk to self within the past 6 months prior to admission? : Yes Suicidal Intent: Yes-Currently Present Has patient had any suicidal intent within the past 6 months prior to admission? : Yes Is patient at risk for suicide?: Yes Suicidal Plan?: Yes-Currently Present Has patient had any suicidal plan within the past 6 months prior to admission? : Yes Specify Current Suicidal Plan: Tara Schmidt overdosed, superficially cut wrists and wraped cord around her neck Access to Means: Yes Specify Access to Suicidal Means: Access to prescriptioin medications What has been your use of drugs/alcohol within the last 12 months?: Tara Schmidt denies abuse Previous Attempts/Gestures: Yes How many times?: 1 (Overdose in 2014) Other Self Harm Risks:  None Triggers for Past Attempts: Family contact Intentional Self Injurious Behavior: None Family Suicide History: No Recent stressful life event(s): Conflict (Comment) (Custody conflict regarding daughter) Persecutory voices/beliefs?: No Depression: Yes Depression Symptoms: Despondent, Insomnia, Tearfulness, Isolating, Fatigue, Guilt, Loss of interest in usual pleasures, Feeling worthless/self pity, Feeling angry/irritable Substance abuse history and/or treatment for substance abuse?: No Suicide prevention information given to non-admitted patients: Not applicable  Risk to Others within the past 6 months Homicidal Ideation: No Does patient have any lifetime risk of violence toward others beyond the six months prior to admission? : No Thoughts of Harm to Others: No Current Homicidal Intent: No Current Homicidal Plan: No Access to Homicidal Means: No Identified Victim: None History of harm to others?: No Assessment of Violence: None Noted Violent Behavior Description: Tara Schmidt denies history of violence Does patient have access to weapons?: No Criminal Charges Pending?: No Does patient have a court date: No Is patient on probation?: No  Psychosis Hallucinations: None noted Delusions: None noted  Mental Status Report Appearance/Hygiene: In scrubs Eye Contact: Good Motor Activity: Unremarkable Speech: Slow, Logical/coherent Level of Consciousness: Drowsy Mood: Depressed, Anxious Affect: Depressed Anxiety Level: Panic Attacks Panic attack frequency: Daily Most recent panic attack: Today Thought Processes: Coherent, Relevant Judgement: Partial Orientation: Person, Place, Time, Situation, Appropriate for developmental age Obsessive Compulsive Thoughts/Behaviors: None  Cognitive Functioning Concentration: Fair Memory: Recent Intact, Remote Intact IQ: Average Insight: Fair Impulse Control: Fair Appetite: Fair Weight Loss: 0 Weight Gain: 0 Sleep: Decreased Total Hours of  Sleep:  6 Vegetative Symptoms: None  ADLScreening [facility] Assessment Services) Patient's cognitive ability adequate to safely complete daily activities?: Yes Patient able to express need for assistance with ADLs?: Yes Independently performs ADLs?: Yes (appropriate for developmental age)  Prior Inpatient Therapy Prior Inpatient Therapy: Yes Prior Therapy Dates: 2014 Prior Therapy Facilty/Provider(s): [facility] Reason for Treatment: Suicide attempt  Prior Outpatient Therapy Prior Outpatient Therapy: No Prior Therapy Dates: NA Prior Therapy Facilty/Provider(s): NA Reason for Treatment: NA Does patient have an ACCT team?: No Does patient have Intensive In-House Services?  : No Does patient have [facility] services? : No Does patient have P4CC services?: No  ADL Screening (condition at time of admission) Patient's cognitive ability adequate to safely complete daily activities?: Yes Is the patient deaf or have difficulty hearing?: No Does the patient have difficulty seeing, even when wearing glasses/contacts?: No Does the patient have difficulty concentrating, remembering, or making decisions?: No Patient able to express need for assistance with ADLs?: Yes Does the patient have difficulty dressing or bathing?: No Independently performs ADLs?: Yes (appropriate for developmental age) Does the patient have difficulty walking or climbing stairs?: No Weakness of Legs: None Weakness of Arms/Hands: None  Home Assistive Devices/Equipment Home Assistive Devices/Equipment: None    Abuse/Neglect Assessment (Assessment to be complete while patient is alone) Physical Abuse: Denies Verbal Abuse: Denies Sexual Abuse: Denies Exploitation of patient/patient's resources: Denies Self-Neglect: Denies     Regulatory affairs officer (For Healthcare) Does patient have an advance directive?: No Would patient like information on creating an advanced directive?: No - patient declined information     Additional Information 1:1 In Past 12 Months?: No CIRT Risk: No Elopement Risk: No Does patient have medical clearance?: Yes     Disposition: Lavell Luster, AC at [facility], confirms adult unit is at capacity. Gave clinical report to Serena Colonel, NP who said Tara Schmidt meets criteria for inpatient psychiatric treatment. TTS will contact other facilities for placement. Notified Abigail Butts, PA-C and Anderson Malta, RN of recommendation.  Disposition Initial Assessment Completed for this Encounter: Yes Disposition of Patient: Inpatient treatment program Type of inpatient treatment program: Adult   Evelena Peat, [facility], [facility] Triage Specialist [phone]   Evelena Peat 06/12/2015 12:54 AM

## 2015-06-12 NOTE — ED Notes (Signed)
Psychiatrist and NP at bedside to assess

## 2015-06-13 ENCOUNTER — Encounter (HOSPITAL_COMMUNITY): Payer: Self-pay | Admitting: Psychiatry

## 2015-06-13 DIAGNOSIS — T1491 Suicide attempt: Secondary | ICD-10-CM

## 2015-06-13 DIAGNOSIS — T424X2A Poisoning by benzodiazepines, intentional self-harm, initial encounter: Secondary | ICD-10-CM

## 2015-06-13 DIAGNOSIS — F332 Major depressive disorder, recurrent severe without psychotic features: Principal | ICD-10-CM

## 2015-06-13 MED ORDER — HYDROXYZINE HCL 25 MG PO TABS
25.0000 mg | ORAL_TABLET | Freq: Three times a day (TID) | ORAL | Status: DC | PRN
  Administered 2015-06-13 – 2015-06-16 (×6): 25 mg via ORAL
  Filled 2015-06-13 (×11): qty 1

## 2015-06-13 MED ORDER — IBUPROFEN 600 MG PO TABS
ORAL_TABLET | ORAL | Status: AC
  Filled 2015-06-13: qty 1

## 2015-06-13 MED ORDER — MOMETASONE FURO-FORMOTEROL FUM 100-5 MCG/ACT IN AERO
1.0000 | INHALATION_SPRAY | Freq: Two times a day (BID) | RESPIRATORY_TRACT | Status: DC
  Administered 2015-06-13 – 2015-06-15 (×6): 1 via RESPIRATORY_TRACT
  Filled 2015-06-13 (×2): qty 8.8

## 2015-06-13 MED ORDER — FLUOXETINE HCL 20 MG PO CAPS
20.0000 mg | ORAL_CAPSULE | Freq: Every day | ORAL | Status: DC
  Administered 2015-06-13 – 2015-06-16 (×4): 20 mg via ORAL
  Filled 2015-06-13 (×7): qty 1

## 2015-06-13 MED ORDER — LORAZEPAM 1 MG PO TABS
1.0000 mg | ORAL_TABLET | Freq: Four times a day (QID) | ORAL | Status: DC | PRN
  Administered 2015-06-13 – 2015-06-15 (×5): 1 mg via ORAL
  Filled 2015-06-13 (×5): qty 1

## 2015-06-13 MED ORDER — IBUPROFEN 600 MG PO TABS
600.0000 mg | ORAL_TABLET | Freq: Four times a day (QID) | ORAL | Status: DC | PRN
  Administered 2015-06-13 – 2015-06-15 (×6): 600 mg via ORAL
  Filled 2015-06-13 (×5): qty 1

## 2015-06-13 MED ORDER — LORATADINE 10 MG PO TABS
10.0000 mg | ORAL_TABLET | Freq: Every day | ORAL | Status: DC
  Administered 2015-06-13: 10 mg via ORAL
  Filled 2015-06-13 (×4): qty 1

## 2015-06-13 MED ORDER — ALBUTEROL SULFATE HFA 108 (90 BASE) MCG/ACT IN AERS: 1.0000 | INHALATION_SPRAY | Freq: Four times a day (QID) | RESPIRATORY_TRACT | Status: DC | PRN

## 2015-06-13 MED ORDER — TRAZODONE HCL 50 MG PO TABS
50.0000 mg | ORAL_TABLET | Freq: Every day | ORAL | Status: DC
  Administered 2015-06-13 – 2015-06-14 (×2): 50 mg via ORAL
  Filled 2015-06-13 (×3): qty 1

## 2015-06-13 MED ORDER — QUETIAPINE FUMARATE 100 MG PO TABS
100.0000 mg | ORAL_TABLET | Freq: Every day | ORAL | Status: DC
  Administered 2015-06-13 – 2015-06-15 (×3): 100 mg via ORAL
  Filled 2015-06-13 (×4): qty 1
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
## 2015-06-13 NOTE — Plan of Care (Signed)
Problem: Diagnosis: Increased Risk For Suicide Attempt Goal: STG-Patient Will Attend All Groups On The Unit Outcome: Progressing Pt attended evening group on 06/12/15
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
## 2015-06-13 NOTE — Tx Team (Signed)
Interdisciplinary Treatment Plan Update (Adult) Date: 06/13/2015    Time Reviewed: 9:30 AM  Progress in Treatment: Attending groups: Continuing to assess, patient new to milieu Participating in groups: Continuing to assess, patient new to milieu Taking medication as prescribed: Yes Tolerating medication: Yes Family/Significant other contact made: No, CSW assessing for appropriate contacts Patient understands diagnosis: Yes Discussing patient identified problems/goals with staff: Yes Medical problems stabilized or resolved: Yes Denies suicidal/homicidal ideation: Yes Issues/concerns per patient self-inventory: Yes Other:  New problem(s) identified: N/A  Discharge Plan or Barriers: Home with outpatient services.   Reason for Continuation of Hospitalization:  Depression Anxiety Medication Stabilization   Comments: N/A  Estimated length of stay: 3-5 days    Patient is a 35 year old female who presented to the hospital with increased depression, anxiety, and suicide attempt by overdosing on prescription medications. Pt reports primary trigger(s) for admission was custody issues. Patient will benefit from crisis stabilization, medication evaluation, group therapy and psycho education in addition to case management for discharge planning. At discharge, it is recommended that Pt remain compliant with established discharge plan and continued treatment.   Review of initial/current patient goals per problem list:  1. Goal(s): Patient will participate in aftercare plan   Met: Yes   Target date: 3-5 days post admission date   As evidenced by: Patient will participate within aftercare plan AEB aftercare provider and housing plan at discharge being identified.  4/25: Goal met. Patient plans to return home to follow up with outpatient services.   2. Goal (s): Patient will exhibit decreased depressive symptoms and suicidal ideations.   Met: No   Target date: 3-5 days post  admission date   As evidenced by: Patient will utilize self rating of depression at 3 or below and demonstrate decreased signs of depression or be deemed stable for discharge by MD.   4/25: Goal not met: Pt presents with flat affect and depressed mood.  Pt admitted with depression rating of 10.  Pt to show decreased sign of depression and a rating of 3 or less before d/c.      3. Goal(s): Patient will demonstrate decreased signs and symptoms of anxiety.   Met: No   Target date: 3-5 days post admission date   As evidenced by: Patient will utilize self rating of anxiety at 3 or below and demonstrated decreased signs of anxiety, or be deemed stable for discharge by MD  4/25: Goal not met: Pt presents with anxious mood and affect.  Pt admitted with anxiety rating of 10.  Pt to show decreased sign of anxiety and a rating of 3 or less before d/c.     Attendees: Patient:    Family:    Physician: Dr. Parke Poisson; Dr. Sabra Heck 06/13/2015 9:30 AM  Nursing: Darrol Angel, Elesa Massed, RN 06/13/2015 9:30 AM  Clinical Social Worker: Tilden Fossa, LCSW 06/13/2015 9:30 AM  Other: Maxie Better, LCSW  06/13/2015 9:30 AM  Other:  06/13/2015 9:30 AM  Other: Lars Pinks, Case Manager 06/13/2015 9:30 AM  Other: Larose Kells, NP 06/13/2015 9:30 AM  Other:               Scribe for Treatment Team:  Tilden Fossa, [place]

## 2015-06-13 NOTE — Progress Notes (Signed)
Adult Psychoeducational Group Note  Date:  06/13/2015 Time:  9:21 PM  Group Topic/Focus:  Wrap-Up Group:   The focus of this group is to help patients review their daily goal of treatment and discuss progress on daily workbooks.  Participation Level:  Active  Participation Quality:  Appropriate and Attentive  Affect:  Appropriate  Cognitive:  Appropriate  Insight: Appropriate  Engagement in Group:  Engaged  Modes of Intervention:  Discussion  Additional Comments:  Pt sleep all day and her goal is to get more rest tonight.   Tara Schmidt 06/13/2015, 9:21 PM

## 2015-06-13 NOTE — Progress Notes (Signed)
Tara Schmidt is endorsing a headache 8/10. Anxiety 10/10 and Depression 6/10. She reports that she did not have a goal for today. Denies SI/HI/AVH. Encouragement and support given. Medications administered as prescribed. Continue Q 15 minute checks for patient safety and medication effectiveness.

## 2015-06-13 NOTE — BHH Suicide Risk Assessment (Signed)
[facility] Admission Suicide Risk Assessment   Nursing information obtained from:   patient and chart  Demographic factors:   35 year old employed female, married x 2  Current Mental Status:   see below  Loss Factors:   adolescent daughter no longer living with her, limited access to her children, who are currently with ex husband  Historical Factors:   depression  Risk Reduction Factors:   supportive husband, employed   Total Time spent with patient: 45 minutes Principal Problem:  MDD, recurrent, no psychotic features  Diagnosis:   Patient Active Problem List   Diagnosis Date Noted  . MDD (major depressive disorder) ([place]) [F32.9] 06/12/2015     Continued Clinical Symptoms:  Alcohol Use Disorder Identification Test Final Score (AUDIT): 0 The "Alcohol Use Disorders Identification Test", Guidelines for Use in Primary Care, Second Edition.  World Pharmacologist [facility]). Score between 0-7:  no or low risk or alcohol related problems. Score between 8-15:  moderate risk of alcohol related problems. Score between 16-19:  high risk of alcohol related problems. Score 20 or above:  warrants further diagnostic evaluation for alcohol dependence and treatment.   CLINICAL FACTORS:  35 year old female, status post suicidal attempt by overdosing on BZD and Antihystamine . She also recently cut her wrist- did not need sutures. States she has been struggling with depression, particularly as relates to her children. She is married x 2, and has three children who are currently living with ex husband . States that because teenaged daughter was threatening to run away from home with boyfriend, she sent her to live with the father for a period of time. She states the father ( her ex-husband ) is now refusing for daughter to return to patient, and also is keeping her from seeing her other children, who live with her . She states her overdose was impulsive in the context of  Phone arguments with her exhusband and  daughter as above. Describes neuro-vegetative symptoms of depression , such as anhedonia, sadness, suicidal ideations, changes in appetite and energy level . No psychotic symptoms. She has history of good response to Prozac and Seroquel in the past - denies having had side effects. Of note, reports history of having allergy/rash to Zoloft, but did not have any allergic reaction or side effect to Prozac . Dx- MDD, recurrent, without psychotic features, severe. Status post suicide attempt by overdosing . Plan- inpatient admission - Start Seroquel 100 mgrsd QHS , start Prozac 20 mgrs QDAY . Ativan PRNs for anxiety or potential WDL symptoms - reports she was taking Klonopin daily, but normally only one tablet per day.     Musculoskeletal: Strength & Muscle Tone: within normal limits- no tremors , no diaphoresis, no psychomotor restlessness  Gait & Station: normal Patient leans: N/A  Psychiatric Specialty Exam: ROS  Blood pressure 114/71, pulse 74, temperature 98 F (36.7 C), temperature source Oral, resp. rate 17, height 5' 0.5" (1.537 m), weight 130 lb (58.968 kg), last menstrual period 02/19/2007, SpO2 100 %.Body mass index is 24.96 kg/(m^2).  General Appearance: Fairly Groomed  Engineer, water::  Good  Speech:  Normal Rate  Volume:  Decreased  Mood:  Depressed  Affect:  Constricted  Thought Process:  Linear  Orientation:  Other:  fully alert and attentive   Thought Content:  denies hallucinations, no delusions, not internally preoccupied, ruminative about stressors as above   Suicidal Thoughts:  No- at this time denies any ongoing suicidal ideations, and denies any self injurious  ideations, contracts for safety on unit   Homicidal Thoughts:  No- specifically also denies any violent or homicidal ideation toward her ex husband   Memory:  recent and remote grossly intact   Judgement:  Fair  Insight:  Fair  Psychomotor Activity:  Normal  Concentration:  Good  Recall:  Good  Fund of  Knowledge:Good  Language: Negative  Akathisia:  NA  Handed:  Right  AIMS (if indicated):     Assets:  Desire for Improvement Resilience  Sleep:  Number of Hours: 6  Cognition: WNL  ADL's:  Intact    COGNITIVE FEATURES THAT CONTRIBUTE TO RISK:  Closed-mindedness and Loss of executive function    SUICIDE RISK:   Moderate:  Frequent suicidal ideation with limited intensity, and duration, some specificity in terms of plans, no associated intent, good self-control, limited dysphoria/symptomatology, some risk factors present, and identifiable protective factors, including available and accessible social support.  PLAN OF CARE: Patient will be admitted to inpatient psychiatric unit for stabilization and safety. Will provide and encourage milieu participation. Provide medication management and maked adjustments as needed.  Will follow daily.    I certify that inpatient services furnished can reasonably be expected to improve the patient's condition.   Neita Garnet, MD 06/13/2015, 1:05 PM

## 2015-06-13 NOTE — Progress Notes (Signed)
D- Patient has been attending groups, engaged in therapy patient denies SI, HI and AVH but reports increased anxiety for which she has used prn medication and help decrease.  Patient has been invested in treatment.   A-  Assess patient for safety, offer medications as prescribed engage patient in 1:1 staff talks.   R-  Patient able to contract for safety.

## 2015-06-14 DIAGNOSIS — F332 Major depressive disorder, recurrent severe without psychotic features: Secondary | ICD-10-CM | POA: Insufficient documentation

## 2015-06-14 MED ORDER — ENSURE ENLIVE PO LIQD
237.0000 mL | Freq: Two times a day (BID) | ORAL | Status: DC
  Administered 2015-06-14 – 2015-06-16 (×4): 237 mL via ORAL

## 2015-06-14 NOTE — Progress Notes (Signed)
Recreation Therapy Notes  Date: 04.26.2017 Time: 9:30am Location: [address] Dayroom   Group Topic: Stress Management  Goal Area(s) Addresses:  Patient will actively participate in stress management techniques presented during session.   Behavioral Response: Did not attend.    Laureen Ochs Sugar Vanzandt, LRT/CTRS        Dylann Gallier L 06/14/2015 2:24 PM

## 2015-06-14 NOTE — Progress Notes (Addendum)
[facility] MD Progress Note  06/14/2015 12:47 PM Tara Schmidt)  MRN:  [id number] Subjective:   Patient reports she is feeling " a little better". Continues to feel depressed, anxious, but to lesser degree than on admission. Denies medication side effects at this time . Objective : I have discussed case with treatment team and have met with patient . Patient remains depressed, anxious,constricted, but reports she is starting to feel better, less severely depressed . Remains ruminative about her family stressors, and not having access to her children at this time, who are now living with their father. Does state she had a phone conversation with daughter which " went all right", and that her husband came to visit, which helped her feel better as well . At this time does not endorse medication side effects. Denies suicidal ideations, contracts for safety on unit. Visible on unit. No disruptive or agitated behaviors. No current symptoms of BZD WDL- no tremors, no diaphoresis, no acute distress/restlessness  .  Principal Problem: MDD (major depressive disorder) ([place]) Diagnosis:   Patient Active Problem List   Diagnosis Date Noted  . MDD (major depressive disorder) ([place]) [F32.9] 06/12/2015   Total Time spent with patient: 20 minutes    Past Medical History:  Past Medical History  Diagnosis Date  . Suicide attempt ([place])     x 3  . Depression   . Anxiety   . Asthma     Past Surgical History  Procedure Laterality Date  . Abdominal hysterectomy     Family History: History reviewed. No pertinent family history.  Social History:  History  Alcohol Use  . Yes     History  Drug Use No    Social History   Social History  . Marital Status: Married    Spouse Name: N/A  . Number of Children: N/A  . Years of Education: N/A   Social History Main Topics  . Smoking status: Passive Smoke Exposure - Never Smoker  . Smokeless tobacco: Never Used  . Alcohol Use: Yes  . Drug Use: No  .  Sexual Activity: Yes    Birth Control/ Protection: Surgical   Other Topics Concern  . None   Social History Narrative   Additional Social History:   Sleep: improving   Appetite:  Fair  Current Medications: Current Facility-Administered Medications  Medication Dose Route Frequency Provider Last Rate Last Dose  . acetaminophen (TYLENOL) tablet 650 mg  650 mg Oral Q6H PRN Derrill Center, NP   650 mg at 06/12/15 2008  . albuterol (PROVENTIL HFA;VENTOLIN HFA) 108 (90 Base) MCG/ACT inhaler 1-2 puff  1-2 puff Inhalation Q6H PRN Encarnacion Slates, NP      . alum & mag hydroxide-simeth (MAALOX/MYLANTA) 200-200-20 MG/5ML suspension 30 mL  30 mL Oral Q4H PRN Derrill Center, NP      . FLUoxetine (PROZAC) capsule 20 mg  20 mg Oral Daily Jenne Campus, MD   20 mg at 06/14/15 0723  . hydrOXYzine (ATARAX/VISTARIL) tablet 25 mg  25 mg Oral TID PRN Encarnacion Slates, NP   25 mg at 06/14/15 1034  . ibuprofen (ADVIL,MOTRIN) tablet 600 mg  600 mg Oral Q6H PRN Laverle Hobby, PA-C   600 mg at 06/14/15 0725  . loperamide (IMODIUM) capsule 2 mg  2 mg Oral PRN Laverle Hobby, PA-C   2 mg at 06/12/15 2104  . LORazepam (ATIVAN) tablet 1 mg  1 mg Oral Q6H PRN Jenne Campus, MD  1 mg at 06/13/15 2001  . magnesium hydroxide (MILK OF MAGNESIA) suspension 30 mL  30 mL Oral Daily PRN Derrill Center, NP      . mometasone-formoterol (DULERA) 100-5 MCG/ACT inhaler 1 puff  1 puff Inhalation BID Encarnacion Slates, NP   1 puff at 06/14/15 0723  . pneumococcal 23 valent vaccine (PNU-IMMUNE) injection 0.5 mL  0.5 mL Intramuscular Tomorrow-1000 Willella Harding A Brainard Highfill, MD      . QUEtiapine (SEROQUEL) tablet 100 mg  100 mg Oral QHS Encarnacion Slates, NP   100 mg at 06/13/15 2130  . traZODone (DESYREL) tablet 50 mg  50 mg Oral QHS Encarnacion Slates, NP   50 mg at 06/13/15 2130    Lab Results: No results found for this or any previous visit (from the past 48 hour(s)).  Blood Alcohol level:  Lab Results  Component Value Date   ETH <5  06/11/2015    Physical Findings: AIMS: Facial and Oral Movements Muscles of Facial Expression: None, normal Lips and Perioral Area: None, normal Jaw: None, normal Tongue: None, normal,Extremity Movements Upper (arms, wrists, hands, fingers): None, normal Lower (legs, knees, ankles, toes): None, normal, Trunk Movements Neck, shoulders, hips: None, normal, Overall Severity Severity of abnormal movements (highest score from questions above): None, normal Incapacitation due to abnormal movements: None, normal Patient's awareness of abnormal movements (rate only patient's report): No Awareness, Dental Status Current problems with teeth and/or dentures?: No Does patient usually wear dentures?: No  CIWA:  CIWA-Ar Total: 0 COWS:     Musculoskeletal: Strength & Muscle Tone: within normal limits Gait & Station: normal Patient leans: N/A  Psychiatric Specialty Exam: ROS reports headache, denies chest pain, no shortness of breath, no vomiting   Blood pressure 105/64, pulse 75, temperature 98 F (36.7 C), temperature source Oral, resp. rate 16, height 5' 0.5" (1.537 m), weight 130 lb (58.968 kg), last menstrual period 02/19/2007, SpO2 100 %.Body mass index is 24.96 kg/(m^2).  General Appearance: Fairly Groomed  Engineer, water::  Good  Speech:  Normal Rate  Volume:  Normal  Mood:  depressed, but states she is feeling " a little better "  Affect:  still constricted, slightly tearful , but more reactive   Thought Process:  Linear  Orientation:  Full (Time, Place, and Person)  Thought Content:  denies hallucinations, no delusions, not internally preoccupied   Suicidal Thoughts:  No- denies any suicidal ideations at this time and contracts for safety on the unit   Homicidal Thoughts:  No- denies any homicidal ideations   Memory:  recent and remote grossly intact   Judgement:  Other:  improving   Insight:  improving   Psychomotor Activity:  Normal  Concentration:  Good  Recall:  Good  Fund of  Knowledge:Good  Language: Good  Akathisia:  Negative  Handed:  Right  AIMS (if indicated):     Assets:  Communication Skills Desire for Improvement Resilience  ADL's:  Intact  Cognition: WNL  Sleep:  Number of Hours: 6.75  Assessment - patient presents with partially improved mood, affect, but continues to report depression, remains ruminative about family stressors . Not suicidal , able to contract for safety at this time.  Treatment Plan Summary: Daily contact with patient to assess and evaluate symptoms and progress in treatment, Medication management, Plan inpatient treatment  and medications as below  Encourage group, milieu participation to work on coping skills and symptom reduction Continue  Prozac 20 mgrs QDAY for depression  Continue Seroquel  100 mgrs QHS for mood disorder, insomnia Continue Ativan 1 mg Q 6 hours PRN for anxiety  Continue Trazodone 50 mgrs QHS PRN for insomnia as needed  Treatment team working on disposition planning  Neita Garnet, MD 06/14/2015, 12:47 PM

## 2015-06-14 NOTE — Progress Notes (Signed)
D-  Patient has been complaining of increased anxiety this shift.  Patient reported that her anxiety was so bad that she could not eat due to an uneasy stomach.    A- Assess patient for safety, offer medications as prescribed engage patient in 1:1 staff talks  R-  Patient able to contract for safety.

## 2015-06-15 MED ORDER — LORAZEPAM 0.5 MG PO TABS: 0.5000 mg | ORAL_TABLET | Freq: Four times a day (QID) | ORAL | Status: AC | PRN

## 2015-06-15 NOTE — BHH Group Notes (Signed)
The focus of this group is to educate the patient on the purpose and policies of crisis stabilization and provide a format to answer questions about their admission.  The group details unit policies and expectations of patients while admitted.  Patient attended 0900 nurse education orientation group this morning.  Patient actively participated and had appropriate affect.  Patient is alert.  Patient had appropriate insight and appropriate engagement.  Today patient will work on 3 goals for discharge.  

## 2015-06-15 NOTE — Plan of Care (Signed)
Problem: Consults Goal: Depression Patient Education See Patient Education Module for education specifics.  Outcome: Progressing Nurse discussed depression/coping skills with patient.        

## 2015-06-15 NOTE — BHH Suicide Risk Assessment (Signed)
[place] INPATIENT:  Family/Significant Other Suicide Prevention Education  Suicide Prevention Education:  Education Completed; husband Tara Schmidt [phone],  (name of family member/significant other) has been identified by the patient as the family member/significant other with whom the patient will be residing, and identified as the person(s) who will aid the patient in the event of a mental health crisis (suicidal ideations/suicide attempt).  With written consent from the patient, the family member/significant other has been provided the following suicide prevention education, prior to the and/or following the discharge of the patient.  The suicide prevention education provided includes the following:  Suicide risk factors  Suicide prevention and interventions  National Suicide Hotline telephone number  [facility] assessment telephone number  [facility] Emergency Assistance [place] and/or Residential Mobile Crisis Unit telephone number  Request made of family/significant other to:  Remove weapons (e.g., guns, rifles, knives), all items previously/currently identified as safety concern.    Remove drugs/medications (over-the-counter, prescriptions, illicit drugs), all items previously/currently identified as a safety concern.  The family member/significant other verbalizes understanding of the suicide prevention education information provided.  The family member/significant other agrees to remove the items of safety concern listed above.  Tara Schmidt, Tara Schmidt 06/15/2015, 1:06 PM

## 2015-06-15 NOTE — BHH Group Notes (Signed)
Late Entry From 06/14/15 at 1:15pm:         [place] LCSW Group Therapy       Type of Therapy: Group Therapy  Participation Level: Did Not Attend. Patient invited to participate but declined.   Tilden Fossa, MSW, [place] Clinical Social Worker [facility] [phone]

## 2015-06-15 NOTE — Progress Notes (Signed)
Patient ID: Tara Schmidt, female   DOB: 1980/05/25, 35 y.o.   MRN: [id number] [facility] MD Progress Note  06/15/2015 12:20 PM Alexyia Guarino  MRN:  [id number] Subjective:   Patient states she is feeling a little better, although still feeling depressed. Denies any suicidal ideations . Reports headache and decreased appetite as possible side effects from current medication regimen. Because she has been on Prozac/seroquel combination in the past which she felt helped her and was well tolerated ( no headache at that time) we considered headache may be related to Trazodone . As she improves she is more focused on discharge and is hoping to discharge soon.  Objective : I have discussed case with treatment team and have met with patient . Although still depressed, feeling better overall . Affect is less constricted. Denies suicidal ideations. At this time going to groups, calm, pleasant on approach. States she has had conversations via phone with her daughter that have been an improvement compared to prior , which is also helping her feel better. Denies suicidal ideations, contracts for safety on unit. Visible on unit.  Medication side effects as above.  Principal Problem: MDD (major depressive disorder) ([place]) Diagnosis:   Patient Active Problem List   Diagnosis Date Noted  . Severe episode of recurrent major depressive disorder, without psychotic features ([place]) [F33.2]   . MDD (major depressive disorder) ([place]) [F32.9] 06/12/2015   Total Time spent with patient: 20 minutes    Past Medical History:  Past Medical History  Diagnosis Date  . Suicide attempt ([place])     x 3  . Depression   . Anxiety   . Asthma     Past Surgical History  Procedure Laterality Date  . Abdominal hysterectomy     Family History: History reviewed. No pertinent family history.  Social History:  History  Alcohol Use  . Yes     History  Drug Use No    Social History   Social History  . Marital Status: Married    Spouse Name: N/A  . Number of Children: N/A  . Years of Education: N/A   Social History Main Topics  . Smoking status: Passive Smoke Exposure - Never Smoker  . Smokeless tobacco: Never Used  . Alcohol Use: Yes  . Drug Use: No  . Sexual Activity: Yes    Birth Control/ Protection: Surgical   Other Topics Concern  . None   Social History Narrative   Additional Social History:   Sleep: improving   Appetite:  Fair  Current Medications: Current Facility-Administered Medications  Medication Dose Route Frequency Provider Last Rate Last Dose  . acetaminophen (TYLENOL) tablet 650 mg  650 mg Oral Q6H PRN Derrill Center, NP   650 mg at 06/15/15 1154  . albuterol (PROVENTIL HFA;VENTOLIN HFA) 108 (90 Base) MCG/ACT inhaler 1-2 puff  1-2 puff Inhalation Q6H PRN Encarnacion Slates, NP      . alum & mag hydroxide-simeth (MAALOX/MYLANTA) 200-200-20 MG/5ML suspension 30 mL  30 mL Oral Q4H PRN Derrill Center, NP      . feeding supplement (ENSURE ENLIVE) (ENSURE ENLIVE) liquid 237 mL  237 mL Oral BID BM Myer Peer Cobos, MD   237 mL at 06/15/15 1002  . FLUoxetine (PROZAC) capsule 20 mg  20 mg Oral Daily Jenne Campus, MD   20 mg at 06/15/15 5366  . hydrOXYzine (ATARAX/VISTARIL) tablet 25 mg  25 mg Oral TID PRN Encarnacion Slates, NP   25 mg at 06/15/15 1150  .  ibuprofen (ADVIL,MOTRIN) tablet 600 mg  600 mg Oral Q6H PRN Laverle Hobby, PA-C   600 mg at 06/15/15 1004  . loperamide (IMODIUM) capsule 2 mg  2 mg Oral PRN Laverle Hobby, PA-C   2 mg at 06/12/15 2104  . LORazepam (ATIVAN) tablet 1 mg  1 mg Oral Q6H PRN Jenne Campus, MD   1 mg at 06/15/15 0825  . magnesium hydroxide (MILK OF MAGNESIA) suspension 30 mL  30 mL Oral Daily PRN Derrill Center, NP      . mometasone-formoterol (DULERA) 100-5 MCG/ACT inhaler 1 puff  1 puff Inhalation BID Encarnacion Slates, NP   1 puff at 06/15/15 [phone]  . pneumococcal 23 valent vaccine (PNU-IMMUNE) injection 0.5 mL  0.5 mL Intramuscular Tomorrow-1000 Fernando A Cobos, MD       . QUEtiapine (SEROQUEL) tablet 100 mg  100 mg Oral QHS Encarnacion Slates, NP   100 mg at 06/14/15 2114    Lab Results: No results found for this or any previous visit (from the past 48 hour(s)).  Blood Alcohol level:  Lab Results  Component Value Date   ETH <5 06/11/2015    Physical Findings: AIMS: Facial and Oral Movements Muscles of Facial Expression: None, normal Lips and Perioral Area: None, normal Jaw: None, normal Tongue: None, normal,Extremity Movements Upper (arms, wrists, hands, fingers): None, normal Lower (legs, knees, ankles, toes): None, normal, Trunk Movements Neck, shoulders, hips: None, normal, Overall Severity Severity of abnormal movements (highest score from questions above): None, normal Incapacitation due to abnormal movements: None, normal Patient's awareness of abnormal movements (rate only patient's report): No Awareness, Dental Status Current problems with teeth and/or dentures?: No Does patient usually wear dentures?: No  CIWA:  CIWA-Ar Total: 1 COWS:  COWS Total Score: 1  Musculoskeletal: Strength & Muscle Tone: within normal limits Gait & Station: normal Patient leans: N/A  Psychiatric Specialty Exam: ROS reports headache, denies chest pain, no shortness of breath, no vomiting , reports decreased appetite   Blood pressure 108/67, pulse 101, temperature 98.2 F (36.8 C), temperature source Oral, resp. rate 16, height 5' 0.5" (1.537 m), weight 130 lb (58.968 kg), last menstrual period 02/19/2007, SpO2 100 %.Body mass index is 24.96 kg/(m^2).  General Appearance: Well Groomed  Engineer, water::  Good  Speech:  Normal Rate  Volume:  Normal  Mood:  Gradually improving, still depressed   Affect:  Less constricted   Thought Process:  Linear  Orientation:  Full (Time, Place, and Person)  Thought Content:  denies hallucinations, no delusions, not internally preoccupied   Suicidal Thoughts:  No- denies any suicidal ideations at this time and contracts for  safety on the unit   Homicidal Thoughts:  No- denies any homicidal ideations   Memory:  recent and remote grossly intact   Judgement:  Other:  improving   Insight:  improving   Psychomotor Activity:  Normal  Concentration:  Good  Recall:  Good  Fund of Knowledge:Good  Language: Good  Akathisia:  Negative  Handed:  Right  AIMS (if indicated):     Assets:  Communication Skills Desire for Improvement Resilience  ADL's:  Intact  Cognition: WNL  Sleep:  Number of Hours: 6.5  Assessment - mood and affect gradually improving, less depressed, no suicidal ideations and currently hoping for discharge soon . Reports headaches, which may be related to Trazodone, as she has been on Prozac and on Seroquel in the past without side effects. Reports decreased appetite which she  states could be related to hospital food,could also be related to Prozac. She is taking ensure supplement and drinking fluids .  We discussed, at this time wants to continue meds.   Treatment Plan Summary: Daily contact with patient to assess and evaluate symptoms and progress in treatment, Medication management, Plan inpatient treatment  and medications as below  Encourage group, milieu participation to work on coping skills and symptom reduction Continue  Prozac 20 mgrs QDAY for depression  Continue Seroquel 100 mgrs QHS for mood disorder, insomnia Continue Ativan 1 mg Q 6 hours PRN for anxiety  D/C Trazodone . On Ensure for diet supplementation   Treatment team working on disposition planning  Neita Garnet, MD 06/15/2015, 12:20 PM

## 2015-06-15 NOTE — Progress Notes (Signed)
   D: Pt complained of a headache during the assessment. Pt was flat, depressed and slightly anxious during the assessment. However, when asked about her day pt only stated, "alright". Pt has no questions or concerns.   A:  Support and encouragement was offered. 15 min checks continued for safety.  R: Pt remains safe.

## 2015-06-15 NOTE — BHH Group Notes (Signed)
[place] LCSW Group Therapy 06/15/2015  1:15 PM   Type of Therapy: Group Therapy  Participation Level: Did Not Attend. Patient invited to participate but declined.   Tilden Fossa, MSW, [place] Clinical Social Worker [facility] [phone]

## 2015-06-15 NOTE — [facility] Group Notes (Signed)
Late Entry From 06/14/15 at 8:45am:   [facility] LCSW Aftercare Discharge Planning Group Note    Participation Quality: Did Not Attend. Patient invited to participate but declined.  Rucha Wissinger, MSW, LCSW Clinical Social Worker [facility] [phone]   

## 2015-06-15 NOTE — Progress Notes (Signed)
Arabela states today was a "happy" day. She rates Anxiety 10/10 and Depression 6/10. She states she was able to eat today and is slowly getting her appetite back. Denies SI/HI/AVH. Encouragement and support given. Medications administered as prescribed. Continue to monitor Q 15 minutes for patient safety and medication effectiveness.

## 2015-06-15 NOTE — Progress Notes (Signed)
D:  Patient's self inventory sheet, patient has fair sleep, medication is helpful.  Poor appetite, low energy level, good concentration.  Rated depression 5, hopeless 4, anxiety 7.  Denied withdrawals.  Deneid SI.  Physical problems, headaches, worst pain #8, pain medication is not helpful.  Goal is to discharge.  Plans to take meds.  No discharge plans. A:  Medications administered per MD orders.  Emotional support and encouragement given patient. R:  Denied SI and HI, contracts for safety.  Denied A/V hallucinations.  Safety maintained with 15 minute checks.

## 2015-06-15 NOTE — Progress Notes (Signed)
No pneumonia vaccine available at [facility].  Medication moved to Friday 06/16/2015 at 1000.  Charge nurse informed.

## 2015-06-15 NOTE — [facility] Group Notes (Signed)
Late Entry From 06/13/15 at 1:15pm:          [place] LCSW Group Therapy   Type of Therapy: Group Therapy  Participation Level: Did Not Attend. Patient invited to participate but declined.   Tilden Fossa, MSW, [place] Clinical Social Worker [facility] [phone]

## 2015-06-15 NOTE — [facility] Group Notes (Signed)
Pt attended karaoke group.  Melvina Pangelinan, MHT 

## 2015-06-16 MED ORDER — FLUOXETINE HCL 20 MG PO CAPS: 20.0000 mg | ORAL_CAPSULE | Freq: Every day | ORAL | Status: DC

## 2015-06-16 MED ORDER — CLONAZEPAM 1 MG PO TABS: 1.0000 mg | ORAL_TABLET | Freq: Every day | ORAL | Status: DC | PRN

## 2015-06-16 MED ORDER — QUETIAPINE FUMARATE 100 MG PO TABS: 100.0000 mg | ORAL_TABLET | Freq: Every day | ORAL | Status: DC

## 2015-06-16 MED ORDER — QUETIAPINE FUMARATE 50 MG PO TABS
150.0000 mg | ORAL_TABLET | Freq: Every day | ORAL | Status: DC
  Filled 2015-06-16 (×2): qty 3

## 2015-06-16 MED ORDER — ALBUTEROL SULFATE HFA 108 (90 BASE) MCG/ACT IN AERS: 1.0000 | INHALATION_SPRAY | Freq: Four times a day (QID) | RESPIRATORY_TRACT | Status: DC | PRN

## 2015-06-16 MED ORDER — MOMETASONE FURO-FORMOTEROL FUM 100-5 MCG/ACT IN AERO: 1.0000 | INHALATION_SPRAY | Freq: Two times a day (BID) | RESPIRATORY_TRACT | Status: DC

## 2015-06-16 MED ORDER — HYDROXYZINE PAMOATE 25 MG PO CAPS: 25.0000 mg | ORAL_CAPSULE | Freq: Three times a day (TID) | ORAL | Status: DC | PRN

## 2015-06-16 MED ORDER — QUETIAPINE FUMARATE 50 MG PO TABS: 150.0000 mg | ORAL_TABLET | Freq: Every day | ORAL | Status: DC

## 2015-06-16 NOTE — Tx Team (Signed)
Interdisciplinary Treatment Plan Update (Adult) Date: 06/16/2015    Time Reviewed: 9:30 AM  Progress in Treatment: Attending groups: Infrequently Participating in groups:Minimally Taking medication as prescribed: Yes Tolerating medication: Yes Family/Significant other contact made: Yes, CSW has spoken with husband Patient understands diagnosis: Yes Discussing patient identified problems/goals with staff: Yes Medical problems stabilized or resolved: Yes Denies suicidal/homicidal ideation: Yes Issues/concerns per patient self-inventory: Yes Other:  New problem(s) identified: N/A  Discharge Plan or Barriers: Home with outpatient services.   Reason for Continuation of Hospitalization:  Depression Anxiety Medication Stabilization   Comments: N/A  Estimated length of stay: Discharge anticipated for today 06/16/15    Patient is a 35 year old female who presented to the hospital with increased depression, anxiety, and suicide attempt by overdosing on prescription medications. Pt reports primary trigger(s) for admission was custody issues. Patient will benefit from crisis stabilization, medication evaluation, group therapy and psycho education in addition to case management for discharge planning. At discharge, it is recommended that Pt remain compliant with established discharge plan and continued treatment.   Review of initial/current patient goals per problem list:  1. Goal(s): Patient will participate in aftercare plan   Met: Yes   Target date: 3-5 days post admission date   As evidenced by: Patient will participate within aftercare plan AEB aftercare provider and housing plan at discharge being identified.  4/25: Goal met. Patient plans to return home to follow up with outpatient services.   2. Goal (s): Patient will exhibit decreased depressive symptoms and suicidal ideations.   Met: Adequate for discharge per MD   Target date: 3-5 days post admission date    As evidenced by: Patient will utilize self rating of depression at 3 or below and demonstrate decreased signs of depression or be deemed stable for discharge by MD.   4/25: Goal not met: Pt presents with flat affect and depressed mood.  Pt admitted with depression rating of 10.  Pt to show decreased sign of depression and a rating of 3 or less before d/c.    4/28: Adequate for discharge per MD. Patient rates depression at 5, denies SI. She reports feeling safe to return home at this time.    3. Goal(s): Patient will demonstrate decreased signs and symptoms of anxiety.   Met: Adequate for discharge per MD   Target date: 3-5 days post admission date   As evidenced by: Patient will utilize self rating of anxiety at 3 or below and demonstrated decreased signs of anxiety, or be deemed stable for discharge by MD  4/25: Goal not met: Pt presents with anxious mood and affect.  Pt admitted with anxiety rating of 10.  Pt to show decreased sign of anxiety and a rating of 3 or less before d/c.  4/28: Adequate for discharge per MD. Patient rates anxiety at 8, reports feeling safe to return home today.    Attendees: Patient:    Family:    Physician: Dr. Parke Poisson; Dr. Sabra Heck 06/16/2015 9:30 AM  Nursing: Jacqualine Code, RN 06/16/2015 9:30 AM  Clinical Social Worker: Tilden Fossa, LCSW 06/16/2015 9:30 AM  Other: Maxie Better, LCSW  06/16/2015 9:30 AM  Other: Norberto Sorenson, P4CC 06/16/2015 9:30 AM  Other: Lars Pinks, Case Manager 06/16/2015 9:30 AM  Other: Agustina Caroli, May Augustin, NP 06/16/2015 9:30 AM  Other:              Scribe for Treatment Team:  Tilden Fossa, [place]

## 2015-06-16 NOTE — Progress Notes (Signed)
  [facility] Adult Case Management Discharge Plan :  Will you be returning to the same living situation after discharge:  Yes,  patient plans to return home with her husband At discharge, do you have transportation home?: Yes,  patient reports access to transportation Do you have the ability to pay for your medications: Yes,  patient will be provided with prescriptions at discharge  Release of information consent forms completed and in the chart;  Patient's signature needed at discharge.  Patient to Follow up at: Follow-up Information    Follow up with [facility] On 06/21/2015.   Why:  Hospital discharge follow up appointment on May 3 at 9:45 AM   Contact information:   [id number],  [place], [place] [address]  Phone: [phone] Fax:  [phone]      Follow up with [facility] On 06/22/2015.   Why:  Therapy appt with Waymon Amato on Thursday May 4th at 3pm. Bring insurance card to appt and  call office if you need to reschedule.   Contact information:   [address]  [place], [place] [address]  Phone: [phone]  Fax:  [phone]      Follow up with [facility].   Why:  Referral made 4/26. Social worker will call you with appointment date & time within 2 business days of discharge.    Contact information:   [address], [place] [address] [phone]      Next level of care provider has access to [place] and Suicide Prevention discussed: Yes,  with patient and husband     Has patient been referred to the Quitline?: Patient refused referral  Patient has been referred for addiction treatment: Yes  Kathyjo Briere, [name] 06/16/2015, 11:41 AM

## 2015-06-16 NOTE — [facility] Group Notes (Signed)
   [facility] LCSW Aftercare Discharge Planning Group Note  06/16/2015  8:45 AM   Participation Quality: Alert, Appropriate and Oriented  Mood/Affect: Blunted, anxious  Depression Rating: 5  Anxiety Rating: 8  Thoughts of Suicide: Pt denies SI/HI  Will you contract for safety? Yes  Current AVH: Pt denies  Plan for Discharge/Comments: Pt attended discharge planning group and actively participated in group. CSW provided pt with today's workbook. Patient reports feeling safe to discharge home today. She plans to follow up with outpatient services.   Transportation Means: Pt reports access to transportation  Supports: No supports mentioned at this time  Tilden Fossa, MSW, [employer] Clinical Social Worker [employer] [phone]

## 2015-06-16 NOTE — Discharge Summary (Signed)
Physician Discharge Summary Note  Patient:  Tara Schmidt is an 35 y.o., female MRN:  [id number] DOB:  1980/03/21 Patient phone:  [phone] (home)  Patient address:   [address] [place] [address],  Total Time spent with patient: Greater than 30 minutes  Date of Admission:  06/12/2015 Date of Discharge: 06-16-15  Reason for Admission: Worsening symptoms of depression  Principal Problem: MDD (major depressive disorder) [facility])  Discharge Diagnoses: Patient Active Problem List   Diagnosis Date Noted  . Severe episode of recurrent major depressive disorder, without psychotic features ([place]) [F33.2]   . MDD (major depressive disorder) ([place]) [F32.9] 06/12/2015   Past Psychiatric History: Bipolar affective disorder  Past Medical History:  Past Medical History  Diagnosis Date  . Suicide attempt ([place])     x 3  . Depression   . Anxiety   . Asthma     Past Surgical History  Procedure Laterality Date  . Abdominal hysterectomy     Family History: History reviewed. No pertinent family history.  Family Psychiatric  History: See H&P  Social History:  History  Alcohol Use  . Yes     History  Drug Use No    Social History   Social History  . Marital Status: Married    Spouse Name: N/A  . Number of Children: N/A  . Years of Education: N/A   Social History Main Topics  . Smoking status: Passive Smoke Exposure - Never Smoker  . Smokeless tobacco: Never Used  . Alcohol Use: Yes  . Drug Use: No  . Sexual Activity: Yes    Birth Control/ Protection: Surgical   Other Topics Concern  . None   Social History Narrative   Hospital Course: Tara Schmidt is a 35 year old Caucasian female with hx of Bipolar disorder. She is being admitted to the [facility] adult unit from the [facility] with complaints of suicide attempt by an overdose. Tara Schmidt also has hx of suicide attempt in 2014 & was hospitalized at the [facility] in [place], [place]. During this  assessment, She reports, "My husband took me to the [facility] yesterday. I had some issues with my ex-husband. He does not want me to see my children. About 1 year ago, I allowed my 3 boys to live with him while I lived with my daughter. My ex-husband is very religious, so are my children. Then, 2 weeks ago, my 51 year old daughter was threatening to run away with her 20 year old boyfriend. I knew that was not right, so I send my 69 year old daughter to go & live my ex-husband & the boys. So, after she got there, my daughter called & told me that she wants to come back home as she is no longer considering running away with her 24 year old boyfriend. But, my ex-husband would not let her come home. When I got the news that my daughter was not coming back home, I got very angry. First, I broke some windows, cut on my wrist, then took some pills in a suicide attempt. I had attempted suicide in 2014 after my second marriage ended, could not deal with the stress, attempted suicide. I was hospitalized at the [facility]. Later, was receiving psychiatric treatment at the [facility] in [place]. Was on Seroquel, Prozac, Xanax, Vistaril & Trazodone 50 mg. I'm still on the antianxiety medications. I stopped the Seroquel & prozac 1 year ago. Would like to be put  back on the Seroquel, Xanax & Prozac. I have bad panic attack episodes".  Tara Schmidt was admitted to the hospital for worsening symptoms of depression & crisis management due to suicide attempt by overdose. She cited familial stressors as the reason for her suicide attempt. She was in need of mood stabilization treatments. After her admission assessment, her presenting symptoms were identified. The medication regimen targeting those symptoms were initiated. She was medicated & discharged on; Fluoxetine 20 mg for depression, Clonazepam 1 mg for anxiety, Hydroxyzine 25 mg prn for anxiety & Seroquel 150 mg for mood control. Her other pre-existing medical  problems were identified & her pertinent home medications resumed. Tara Schmidt was enrolled & participated in the group counseling sessions being offered & held on this unit. She learned coping skills..  During the course of her hospitalization, Tara Schmidt's improvement was monitored by observation & her daily report of symptom reduction noted. Her emotional and mental status were monitored by daily self-inventory reports completed by her & the clinical staff. She was evaluated by the treatment team for mood stability & plans for continued recovery after discharge. Tara Schmidt's motivation was an integral factor in her mood stability. She was offered further treatment options upon discharge on an outpatient basis as noted below.   Upon discharge, Tara Schmidt was both mentally & medically stable. She denies suicidal/homicidal ideations, auditory/visual/tactile hallucinations, delusional thoughts or paranoia. She received from the pharmacy, a 7 days worth, supply samples of her [facility] discharge medications. She left [facility] with all belongings in no distress. Transportation per her arrangement.  Physical Findings: AIMS: Facial and Oral Movements Muscles of Facial Expression: None, normal Lips and Perioral Area: None, normal Jaw: None, normal Tongue: None, normal,Extremity Movements Upper (arms, wrists, hands, fingers): None, normal Lower (legs, knees, ankles, toes): None, normal, Trunk Movements Neck, shoulders, hips: None, normal, Overall Severity Severity of abnormal movements (highest score from questions above): None, normal Incapacitation due to abnormal movements: None, normal Patient's awareness of abnormal movements (rate only patient's report): No Awareness, Dental Status Current problems with teeth and/or dentures?: No Does patient usually wear dentures?: No  CIWA:  CIWA-Ar Total: 2 COWS:  COWS Total Score: 0  Musculoskeletal: Strength & Muscle Tone: within normal limits Gait & Station: normal Patient leans:  N/A  Psychiatric Specialty Exam: Review of Systems  Constitutional: Negative.   HENT: Negative.   Eyes: Negative.   Respiratory: Negative.   Cardiovascular: Negative.   Gastrointestinal: Negative.   Genitourinary: Negative.   Musculoskeletal: Negative.   Skin: Negative.   Neurological: Negative.   Endo/Heme/Allergies: Negative.   Psychiatric/Behavioral: Positive for depression (Stable). Negative for suicidal ideas, hallucinations, memory loss and substance abuse. The patient has insomnia (Stable). The patient is not nervous/anxious.     Blood pressure 105/63, pulse 99, temperature 97.6 F (36.4 C), temperature source Oral, resp. rate 16, height 5' 0.5" (1.537 m), weight 58.968 kg (130 lb), last menstrual period 02/19/2007, SpO2 100 %.Body mass index is 24.96 kg/(m^2).  See Md's SRA   Has this patient used any form of tobacco in the last 30 days? (Cigarettes, Smokeless Tobacco, Cigars, and/or Pipes): No  Blood Alcohol level:  Lab Results  Component Value Date   ETH <5 [id number]   Metabolic Disorder Labs:  No results found for: HGBA1C, MPG No results found for: PROLACTIN No results found for: CHOL, TRIG, HDL, CHOLHDL, VLDL, LDLCALC  See Psychiatric Specialty Exam and Suicide Risk Assessment completed by Attending Physician prior to discharge.  Discharge destination:  Home  Is patient  on multiple antipsychotic therapies at discharge:  No   Has Patient had three or more failed trials of antipsychotic monotherapy by history:  No  Recommended Plan for Multiple Antipsychotic Therapies: NA    Medication List    STOP taking these medications        cetirizine 10 MG tablet  Commonly known as:  ZYRTEC      TAKE these medications      Indication   albuterol 108 (90 Base) MCG/ACT inhaler  Commonly known as:  PROVENTIL HFA;VENTOLIN HFA  Inhale 1-2 puffs into the lungs every 6 (six) hours as needed for wheezing or shortness of breath.   Indication:  Asthma     clonazePAM  1 MG tablet  Commonly known as:  KLONOPIN  Take 1 tablet (1 mg total) by mouth daily as needed for anxiety.   Indication:  Anxiety     FLUoxetine 20 MG capsule  Commonly known as:  PROZAC  Take 1 capsule (20 mg total) by mouth daily. For depression   Indication:  Major Depressive Disorder     hydrOXYzine 25 MG capsule  Commonly known as:  VISTARIL  Take 1 capsule (25 mg total) by mouth 3 (three) times daily as needed for anxiety.   Indication:  Anxiety     mometasone-formoterol 100-5 MCG/ACT Aero  Commonly known as:  DULERA  Inhale 1 puff into the lungs 2 (two) times daily. For Asthma   Indication:  Asthma     QUEtiapine 50 MG tablet  Commonly known as:  SEROQUEL  Take 3 tablets (150 mg total) by mouth at bedtime. For mood control   Indication:  Mood control       Follow-up Information    Follow up with [facility] On 06/21/2015.   Why:  Hospital discharge follow up appointment on May 3 at 9:45 AM   Contact information:   [id number],  [place], [place] [address]  Phone: [phone] Fax:  [phone]      Follow up with [facility] On 06/22/2015.   Why:  Therapy appt with Waymon Amato on Thursday May 4th at 3pm. Bring insurance card to appt and  call office if you need to reschedule.   Contact information:   [address]  [place], [place] [address]  Phone: [phone]  Fax:  [phone]     Follow-up recommendations:  Activity:  As tolerated Diet: As recommended by your primary care doctor. Keep all scheduled follow-up appointments as recommended.  Comments: Take all your medications as prescribed by your mental healthcare provider. Report any adverse effects and or reactions from your medicines to your outpatient provider promptly. Patient is instructed and cautioned to not engage in alcohol and or illegal drug use while on prescription medicines. In the event of worsening symptoms, patient is instructed to call the  crisis hotline, 911 and or go to the nearest ED for appropriate evaluation and treatment of symptoms. Follow-up with your primary care provider for your other medical issues, concerns and or health care needs.   Signed: Encarnacion Slates, NP, PMHNP, FNP-BC 06/16/2015, 11:23 AM   Patient seen, Suicide Assessment Completed.  Disposition Plan Reviewed

## 2015-06-16 NOTE — Progress Notes (Signed)
Patient ID: Tara Schmidt, female   DOB: May 23, 1980, 35 y.o.   MRN: [id number]  Pt currently presents with a flat affect and depressed behavior. Per self inventory, pt rates depression at a 5, hopelessness 4 and anxiety 8. Pt's daily goal is to "going home" and they intend to do so by "stating I'm worth living." Pt reports poor sleep, a fair appetite, low energy and good concentration. Pt concerned about Vistaril "not being enough for my anxiety" post discharge. Pt also reports that she did not sleep well without her Seroquel last night.   Pt provided with medications per providers orders. Pt's labs and vitals were monitored throughout the day. Pt supported emotionally and encouraged to express concerns and questions. Pt educated on medications. Pneumonia vaccine given, tolerated well. Md notified of anxiolytic and insomnia concerns.   Pt's safety ensured with 15 minute and environmental checks. Pt currently denies SI/HI and A/V hallucinations. Pt verbally agrees to seek staff if SI/HI or A/VH occurs and to consult with staff before acting on these thoughts. Pt to be discharged today per MD. Will continue POC.

## 2015-06-16 NOTE — Progress Notes (Addendum)
Patient ID: Tara Schmidt, female   DOB: [birth year], 35 y.o.   MRN: [id number]  Pt discharged home with her husband. Pt was stable and appreciative at that time. All papers and prescriptions were given and valuables returned. Verbal understanding expressed. Denies SI/HI and A/VH. Pt given opportunity to express concerns and ask questions.

## 2015-06-16 NOTE — BHH Suicide Risk Assessment (Signed)
[facility] Discharge Suicide Risk Assessment   Principal Problem: MDD (major depressive disorder) [facility]) Discharge Diagnoses:  Patient Active Problem List   Diagnosis Date Noted  . Severe episode of recurrent major depressive disorder, without psychotic features ([place]) [F33.2]   . MDD (major depressive disorder) ([place]) [F32.9] 06/12/2015    Total Time spent with patient: 30 minutes  Musculoskeletal: Strength & Muscle Tone: within normal limits Gait & Station: normal Patient leans: N/A  Psychiatric Specialty Exam: ROS  Blood pressure 105/63, pulse 99, temperature 97.6 F (36.4 C), temperature source Oral, resp. rate 16, height 5' 0.5" (1.537 m), weight 130 lb (58.968 kg), last menstrual period 02/19/2007, SpO2 100 %.Body mass index is 24.96 kg/(m^2).  General Appearance: Well Groomed  Eye Contact::  Good  Speech:  Normal Rate409  Volume:  Normal  Mood:  improved mood, improved range of affect   Affect:  more reactive   Thought Process:  Linear  Orientation:  Full (Time, Place, and Person)  Thought Content:  less ruminative about family stressors , no hallucinations, no delusions   Suicidal Thoughts:  No- denies any suicidal or self injurious ideations   Homicidal Thoughts:  No  Memory:  recent and remote grossly intact   Judgement:  Other:  improved   Insight:  improving   Psychomotor Activity:  Normal  Concentration:  Good  Recall:  Good  Fund of Knowledge:Good  Language: Good  Akathisia:  Negative  Handed:  Right  AIMS (if indicated):     Assets:  Communication Skills Desire for Improvement Resilience  Sleep:  Number of Hours: 6.25  Cognition: WNL  ADL's:  Intact   Mental Status Per Nursing Assessment::   On Admission:     Demographic Factors:  35 year old female, married, lives with husband   Loss Factors: Her three children are with her ex-husband, states that he has not allowed for her daughter to return to live with her  Historical Factors: History of  depression, history of anxiety  Risk Reduction Factors:   Sense of responsibility to family, Living with another person, especially a relative and Positive coping skills or problem solving skills  Continued Clinical Symptoms:  At this time patient improved, well groomed, calm, mood improved,  fuller range of affect, no thought disorder, no SI or HI, no psychotic symptoms, future oriented , denies medication side effects. Tolerating medications well , but hoping to increase Seroquel further to 150 mgrs  HS for further help with night time anxiety and insomnia. We reviewed medication side effects , to include risk of sedation, metabolic side effects, weight gain, and movement disorders    Cognitive Features That Contribute To Risk:  No gross cognitive deficits noted upon discharge. Is alert , attentive, and oriented x 3   Suicide Risk:  Mild:  Suicidal ideation of limited frequency, intensity, duration, and specificity.  There are no identifiable plans, no associated intent, mild dysphoria and related symptoms, good self-control (both objective and subjective assessment), few other risk factors, and identifiable protective factors, including available and accessible social support.  Follow-up Information    Follow up with [facility] On 06/21/2015.   Why:  Hospital discharge follow up appointment on May 3 at 9:45 AM   Contact information:   [id number],  [place], [place] [address]  Phone: [phone] Fax:  [phone]      Follow up with [facility] On 06/22/2015.   Why:  Therapy appt with Waymon Amato on Thursday  May 4th at 3pm. Bring insurance card to appt and  call office if you need to reschedule.   Contact information:   [address]  [place], [place] [address]  Phone: [phone]  Fax:  [phone]      Follow up with [facility].   Why:  Referral made 4/26. Social worker will call you with appointment date & time  within 2 business days of discharge.    Contact information:   [address] [place], [place] [address] [phone]      Plan Of Care/Follow-up recommendations:  Activity:  as tolerated Diet:  Regular Tests:  NA Other:  see below  Patient is leaving unit in good spirits  States husband will pick her up later today Plans to return home  Plans to follow up as above  Neita Garnet, MD 06/16/2015, 12:40 PM

## 2015-06-19 ENCOUNTER — Inpatient Hospital Stay (HOSPITAL_COMMUNITY)
Admission: RE | Admit: 2015-06-19 | Discharge: 2015-06-22 | DRG: 885 | Disposition: A | Discharge status: Home / Self Care | Payer: Medicaid Other | Source: Intra-hospital | Attending: Psychiatry | Admitting: Psychiatry

## 2015-06-19 ENCOUNTER — Encounter (HOSPITAL_COMMUNITY): Payer: Self-pay | Admitting: Nurse Practitioner

## 2015-06-19 DIAGNOSIS — F332 Major depressive disorder, recurrent severe without psychotic features: Secondary | ICD-10-CM | POA: Diagnosis present

## 2015-06-19 DIAGNOSIS — J45909 Unspecified asthma, uncomplicated: Secondary | ICD-10-CM | POA: Diagnosis present

## 2015-06-19 DIAGNOSIS — F410 Panic disorder [episodic paroxysmal anxiety] without agoraphobia: Secondary | ICD-10-CM | POA: Diagnosis present

## 2015-06-19 DIAGNOSIS — G4700 Insomnia, unspecified: Secondary | ICD-10-CM | POA: Diagnosis present

## 2015-06-19 DIAGNOSIS — F329 Major depressive disorder, single episode, unspecified: Secondary | ICD-10-CM | POA: Diagnosis present

## 2015-06-19 DIAGNOSIS — F411 Generalized anxiety disorder: Secondary | ICD-10-CM | POA: Diagnosis present

## 2015-06-19 MED ORDER — ALUM & MAG HYDROXIDE-SIMETH 200-200-20 MG/5ML PO SUSP: 30.0000 mL | ORAL | Status: DC | PRN

## 2015-06-19 MED ORDER — MAGNESIUM HYDROXIDE 400 MG/5ML PO SUSP: 30.0000 mL | Freq: Every day | ORAL | Status: DC | PRN

## 2015-06-19 MED ORDER — QUETIAPINE FUMARATE 50 MG PO TABS
150.0000 mg | ORAL_TABLET | Freq: Every day | ORAL | Status: DC
  Administered 2015-06-20 – 2015-06-21 (×3): 150 mg via ORAL
  Filled 2015-06-19 (×8): qty 1

## 2015-06-19 MED ORDER — ACETAMINOPHEN 325 MG PO TABS: 650.0000 mg | ORAL_TABLET | Freq: Four times a day (QID) | ORAL | Status: DC | PRN

## 2015-06-19 MED ORDER — HYDROXYZINE HCL 25 MG PO TABS
25.0000 mg | ORAL_TABLET | Freq: Three times a day (TID) | ORAL | Status: DC | PRN
  Administered 2015-06-20 – 2015-06-21 (×4): 25 mg via ORAL
  Filled 2015-06-19 (×4): qty 1

## 2015-06-19 NOTE — Tx Team (Signed)
Initial Interdisciplinary Treatment Plan   PATIENT STRESSORS: Marital or family conflict Medication change or noncompliance   PATIENT STRENGTHS: Capable of independent living General fund of knowledge Motivation for treatment/growth Supportive family/friends   PROBLEM LIST: Problem List/Patient Goals Date to be addressed Date deferred Reason deferred Estimated date of resolution  "Lower my depression" 06-20-2015     "Help me consider others before I try to hurt myself" 06-20-2015     Depression  06-20-2015     Suicidal Ideation  06-20-2015                                    DISCHARGE CRITERIA:  Ability to meet basic life and health needs Medical problems require only outpatient monitoring Motivation to continue treatment in a less acute level of care Verbal commitment to aftercare and medication compliance  PRELIMINARY DISCHARGE PLAN: Attend PHP/IOP Outpatient therapy Return to previous living arrangement  PATIENT/FAMIILY INVOLVEMENT: This treatment plan has been presented to and reviewed with the patient, Joniqua Lukins.  The patient and family have been given the opportunity to ask questions and make suggestions.  Gildardo Pounds 06/19/2015, 11:58 PM
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
## 2015-06-20 ENCOUNTER — Encounter (HOSPITAL_COMMUNITY): Payer: Self-pay | Admitting: Psychiatry

## 2015-06-20 DIAGNOSIS — F332 Major depressive disorder, recurrent severe without psychotic features: Principal | ICD-10-CM

## 2015-06-20 MED ORDER — ALBUTEROL SULFATE HFA 108 (90 BASE) MCG/ACT IN AERS: 1.0000 | INHALATION_SPRAY | Freq: Four times a day (QID) | RESPIRATORY_TRACT | Status: DC | PRN

## 2015-06-20 MED ORDER — FLUOXETINE HCL 20 MG PO CAPS
20.0000 mg | ORAL_CAPSULE | Freq: Every day | ORAL | Status: DC
  Administered 2015-06-20 – 2015-06-22 (×3): 20 mg via ORAL
  Filled 2015-06-20 (×8): qty 1

## 2015-06-20 MED ORDER — MOMETASONE FURO-FORMOTEROL FUM 100-5 MCG/ACT IN AERO
1.0000 | INHALATION_SPRAY | Freq: Two times a day (BID) | RESPIRATORY_TRACT | Status: DC
  Administered 2015-06-20 – 2015-06-22 (×4): 1 via RESPIRATORY_TRACT
  Filled 2015-06-20 (×2): qty 8.8

## 2015-06-20 MED ORDER — BACITRACIN-NEOMYCIN-POLYMYXIN 400-5-5000 EX OINT
TOPICAL_OINTMENT | CUTANEOUS | Status: DC | PRN
  Administered 2015-06-21: 1 via TOPICAL
  Filled 2015-06-20 (×2): qty 1

## 2015-06-20 NOTE — Progress Notes (Signed)
Recreation Therapy Notes  Animal-Assisted Activity (AAA) Program Checklist/Progress Notes Patient Eligibility Criteria Checklist & Daily Group note for Rec Tx Intervention  Date: 05.02.2017 Time: 2:45pm Location: [address]   AAA/T Program Assumption of Risk Form signed by Patient/ or Parent Legal Guardian Yes  Patient is free of allergies or sever asthma Yes  Patient reports no fear of animals Yes  Patient reports no history of cruelty to animals Yes  Patient understands his/her participation is voluntary Yes  Patient washes hands before animal contact Yes  Patient washes hands after animal contact Yes  Behavioral Response: Engaged, Appropriate  Education: Contractor, Appropriate Animal Interaction   Education Outcome: Acknowledges education.   Clinical Observations/Feedback: Patient interacted appropriately with therapy dog and peers during session.    Laureen Ochs Kamaya Keckler, LRT/CTRS        Lotus Santillo L 06/20/2015 3:03 PM

## 2015-06-20 NOTE — Progress Notes (Signed)
Adult Psychoeducational Group Note  Date:  06/20/2015 Time:  9:11 PM  Group Topic/Focus:  Wrap-Up Group:   The focus of this group is to help patients review their daily goal of treatment and discuss progress on daily workbooks.  Participation Level:  Active  Participation Quality:  Appropriate  Affect:  Appropriate  Cognitive:  Alert  Insight: Appropriate  Engagement in Group:  Engaged  Modes of Intervention:  Discussion  Additional Comments:  Pt rated her day 7/10. The question asked to her was what is her favorite sport. Her answer was football and her favorite team is the [employer].   Wynelle Fanny R 06/20/2015, 9:11 PM

## 2015-06-20 NOTE — H&P (Signed)
Psychiatric Admission Assessment Adult  Patient Identification: Tara Schmidt  MRN:  [id number]  Date of Evaluation:  06/20/2015  Chief Complaint: Suicide attempt by overdose.   Principal Diagnosis: Major depressive disorder ([place])  Diagnosis:   Patient Active Problem List   Diagnosis Date Noted  . Major depressive disorder ([place]) [F32.9] 06/19/2015  . Severe episode of recurrent major depressive disorder, without psychotic features ([place]) [F33.2]   . MDD (major depressive disorder) ([place]) [F32.9] 06/12/2015   History of Present Illness:  Tara Schmidt, 35 y.o.married female  who was brought to the [facility] voluntarily due to [facility] with self injurious behavior having intentionally made superficial cuts to her wrists and forearms on both arms.  Patient was here at [facility] just recently  (from 4/25- 06/16/15).  Reportedly, pt sts she started intentionally cutting herself one week ago because she sts she "felt numb and wanted to feel something." Pt endorses SI and SHI but denies HI and AVH.  Patient has had 3 previous suicide attempts.  Her recent trigger is due to family stressors.  Patient was due her first appointment with [facility], and had SI on the way home, thinking of jumping from the moving car as they drive.  She stated that her doctor took her off of all her anxiety medications which she states she feels she needs. Patient reports she felt acutely anxious, suicidal and cut self ( has several superficial cuts, no bleeding , no sutures ) on forearm, leading to re-admission. Her primary stressors are not having her children living with her and finances.  Prior diagnoses include Bipolar D/O and GAD.   Associated Signs/Symptoms:   Depression Symptoms:  depressed mood, insomnia, anxiety, panic attacks, weight loss,  (Hypo) Manic Symptoms:  Impulsivity, Irritable Mood, Labiality of Mood,  Anxiety Symptoms:  Panic Symptoms,  Psychotic Symptoms:  Denies any hallucinations, delusional  thoughts or paranoia  PTSD Symptoms: None reported  Total Time spent with patient: 1 hour  Past Psychiatric History: Bipolar affective disorder.  Is the patient at risk to self? No.  Has the patient been a risk to self in the past 6 months? Yes.    Has the patient been a risk to self within the distant past? Yes.   (Hx. Suicide attempt by overdose)  Is the patient a risk to others? No.  Has the patient been a risk to others in the past 6 months? No.  Has the patient been a risk to others within the distant past? No.   Prior Inpatient Therapy: Yes [facility], 2014) Prior Outpatient Therapy: Yes, ([facility] in [place], [place])  Alcohol Screening: 1. How often do you have a drink containing alcohol?: Monthly or less 2. How many drinks containing alcohol do you have on a typical day when you are drinking?: 1 or 2 3. How often do you have six or more drinks on one occasion?: Never Preliminary Score: 0 4. How often during the last year have you found that you were not able to stop drinking once you had started?: Never 5. How often during the last year have you failed to do what was normally expected from you becasue of drinking?: Never 6. How often during the last year have you needed a first drink in the morning to get yourself going after a heavy drinking session?: Never 7. How often during the last year have you had a feeling of guilt of remorse after drinking?: Never 8. How often during the last year have  you been unable to remember what happened the night before because you had been drinking?: Never 9. Have you or someone else been injured as a result of your drinking?: No 10. Has a relative or friend or a doctor or another health worker been concerned about your drinking or suggested you cut down?: No Alcohol Use Disorder Identification Test Final Score (AUDIT): 1 Brief Intervention: AUDIT score less than 7 or less-screening does not suggest  unhealthy drinking-brief intervention not indicated  Abuse History in the last 12 months:  No.  Consequences of Substance Abuse: Denies any drug or alcohol use  Previous Psychotropic Medications: Yes (Xanax, Clonazepam, Trazodone, Seroquel)  Psychological Evaluations: Yes   Past Medical History:  Past Medical History  Diagnosis Date  . Suicide attempt ([place])     x 3  . Depression   . Anxiety   . Asthma     Past Surgical History  Procedure Laterality Date  . Abdominal hysterectomy     Family History: History reviewed. No pertinent family history.  Psychiatric  History: Bipolar affective disorder, Anxiety disorder  Tobacco Screening: @FLOW ([phone])::1)@  History:  History  Alcohol Use No     History  Drug Use No    Additional Social History: Marital status: Married   Allergies:   Allergies  Allergen Reactions  . Effexor [Venlafaxine] Hives  . Latex Hives  . Zoloft [Sertraline Hcl] Hives  . Adhesive [Tape] Hives and Rash   Lab Results:  No results found for this or any previous visit (from the past 48 hour(s)). Blood Alcohol level:  Lab Results  Component Value Date   ETH <5 [id number]   Metabolic Disorder Labs:  No results found for: HGBA1C, MPG No results found for: PROLACTIN No results found for: CHOL, TRIG, HDL, CHOLHDL, VLDL, LDLCALC  Current Medications: Current Facility-Administered Medications  Medication Dose Route Frequency Provider Last Rate Last Dose  . acetaminophen (TYLENOL) tablet 650 mg  650 mg Oral Q6H PRN Laverle Hobby, PA-C      . alum & mag hydroxide-simeth (MAALOX/MYLANTA) 200-200-20 MG/5ML suspension 30 mL  30 mL Oral Q4H PRN Laverle Hobby, PA-C      . FLUoxetine (PROZAC) capsule 20 mg  20 mg Oral Daily Jenne Campus, MD   20 mg at 06/20/15 1213  . hydrOXYzine (ATARAX/VISTARIL) tablet 25 mg  25 mg Oral TID PRN Laverle Hobby, PA-C   25 mg at 06/20/15 1213  . magnesium hydroxide (MILK OF MAGNESIA) suspension 30 mL  30 mL  Oral Daily PRN Laverle Hobby, PA-C      . QUEtiapine (SEROQUEL) tablet 150 mg  150 mg Oral QHS Laverle Hobby, PA-C   150 mg at 06/20/15 0004   PTA Medications: Prescriptions prior to admission  Medication Sig Dispense Refill Last Dose  . albuterol (PROVENTIL HFA;VENTOLIN HFA) 108 (90 Base) MCG/ACT inhaler Inhale 1-2 puffs into the lungs every 6 (six) hours as needed for wheezing or shortness of breath.   Past Week at Unknown time  . clonazePAM (KLONOPIN) 1 MG tablet Take 1 tablet (1 mg total) by mouth daily as needed for anxiety. 5 tablet 0 Past Week at Unknown time  . FLUoxetine (PROZAC) 20 MG capsule Take 1 capsule (20 mg total) by mouth daily. For depression 30 capsule 0 Past Week at Unknown time  . hydrOXYzine (VISTARIL) 25 MG capsule Take 1 capsule (25 mg total) by mouth 3 (three) times daily as needed for anxiety. 60 capsule 0 Past Week at  Unknown time  . mometasone-formoterol (DULERA) 100-5 MCG/ACT AERO Inhale 1 puff into the lungs 2 (two) times daily. For Asthma   Past Week at Unknown time  . QUEtiapine (SEROQUEL) 50 MG tablet Take 3 tablets (150 mg total) by mouth at bedtime. For mood control 90 tablet 0 Past Week at Unknown time   Musculoskeletal: Strength & Muscle Tone: within normal limits Gait & Station: normal Patient leans: N/A  Psychiatric Specialty Exam: Physical Exam  Vitals reviewed. Constitutional: She is oriented to person, place, and time. She appears well-developed.  HENT:  Head: Normocephalic.  Eyes: Pupils are equal, round, and reactive to light.  Neck: Normal range of motion.  Cardiovascular: Normal rate.   Respiratory: Effort normal.  GI: Soft.  Genitourinary:  Denies any issues in this area  Musculoskeletal: Normal range of motion.  Neurological: She is alert and oriented to person, place, and time.  Skin: Skin is warm and dry.  Psychiatric: Her speech is normal and behavior is normal. Thought content normal. Her mood appears anxious. Her affect is  not angry, not blunt, not labile and not inappropriate. Cognition and memory are normal. She expresses impulsivity. She exhibits a depressed mood.    Review of Systems  Constitutional: Negative.   HENT: Negative.   Eyes: Negative.   Respiratory: Negative.   Cardiovascular: Negative.   Genitourinary: Negative.   Skin: Negative.   Neurological: Negative.   Endo/Heme/Allergies: Negative.   Psychiatric/Behavioral: Positive for depression. Negative for suicidal ideas and substance abuse. The patient is nervous/anxious and has insomnia.     Blood pressure 118/79, pulse 73, temperature 97.8 F (36.6 C), temperature source Oral, resp. rate 18, height 5\' 2"  (1.575 m), weight 59.421 kg (131 lb), last menstrual period 02/19/2007.Body mass index is 23.95 kg/(m^2).  General Appearance: Casual  Eye Contact::  Fair  Speech:  Clear, coherent, not spontaneous  Volume:  Decreased  Mood:  Anxious and Depressed  Affect:  Flat  Thought Process:  Coherent and Logical  Orientation:  Full (Time, Place, and Person)  Thought Content:  Rumination, denies any halluciantions, delusional thoughts or paranoia  Suicidal Thoughts:  Patient currently denies   Homicidal Thoughts:  Patient currently denies  Memory:  Immediate;   Good Recent;   Good Remote;   Good  Judgement:  Fair  Insight:  Shallow  Psychomotor Activity:  Decreased  Concentration:  Fair  Recall:  Good  Fund of Knowledge:Fair  Language: Good  Akathisia:  Negative  Handed:  Right  AIMS (if indicated):     Assets:  Desire for Improvement Physical Health  ADL's:  Intact  Cognition: WNL  Sleep:  Number of Hours: 5.75   Treatment Plan Summary: Admit for crisis management and mood stabilization. Medication management to re-stabilize current mood symptoms - Seroquel 150 mgrs QHS, Prozac 20 mgrs QDAY , and Vistaril 25 mgrs TID PRN for anxiety as needed  Group counseling sessions for coping skills Medical consults as needed Review and  reinstate any pertinent home medications for other health problems  Observation Level/Precautions:  15 minute checks  Laboratory:  Per ED  Psychotherapy: Group sessions  Medications: Seroquel 150 mgrs QHS, Prozac 20 mgrs QDAY , and Vistaril 25 mgrs TID PRN for anxiety as needed   Consultations: As needed    Discharge Concerns: Safety, mood stabilization  Estimated LOS: 3-5 days  Other:    I certify that inpatient services furnished can reasonably be expected to improve the patient's condition.    Warner Robins, NP-BC  5/2/20173:36 PM Case discussed with NP and patient seen by me  Agree with NP note and assessment  Patient is a 35 years old married female, known to our service from recent inpatient psychiatric admission ( from 4/25- 06/16/15) . At the time she was admitted for worsening depression and suicidal ideations, which she stated were in the context of severe family stressors- her exhusband currently has her three children and does not want to allow patient's daughter to return to live with patient. Patient reports long history of depression, has had prior admissions for depression, suicidal ideations/attempts by OD, and also stresses history of anxiety, excessive worrying . In the past has been diagnosed with Bipolar Spectrum Disorder, but does not endorse any clear history of mania, hypomania- does report Brief mood swings. She denies alcohol or drug abuse history States that when she was discharged she was doing well, feeling " good ", but that she felt acutely anxious and upset due to ongoing family stressor as above, and because her outpatient provider " took me off my anxiety medications " ( as per patient, did not renew Klonopin or Vistaril). Patient reports she felt acutely anxious, suicidal and cut self ( has several superficial cuts, no bleeding , no sutures ) on forearm, leading to re-admission. States she feels medications she was on were helping ( Prozac, Vistaril, low  dose Klonopin as PRN if needed, Seroquel) Dx - MDD, recurrent, no psychotic symptoms. Anxiety  Plan- Inpatient admission- for now continue Seroquel 150 mgrs QHS, Prozac 20 mgrs QDAY , and Vistaril 25 mgrs TID PRN for anxiety as needed

## 2015-06-20 NOTE — B[facility] Group Notes (Signed)
[place] LCSW Group Therapy  06/20/2015   1:15 PM   Type of Therapy:  Group Therapy  Participation Level:  Active  Participation Quality:  Attentive, Sharing and Supportive  Affect:  Depressed and Flat  Cognitive:  Alert and Oriented  Insight:  Developing/Improving and Engaged  Engagement in Therapy:  Developing/Improving and Engaged  Modes of Intervention:  Clarification, Confrontation, Discussion, Education, Exploration, Limit-setting, Orientation, Problem-solving, Rapport Building, Art therapist, Socialization and Support  Summary of Progress/Problems: The topic for group therapy was feelings about diagnosis.  Pt actively participated in group discussion on their past and current diagnosis and how they feel towards this.  Pt also identified how society and family members judge them, based on their diagnosis as well as stereotypes and stigmas.  Patient discussed her negative self view and how she presents differently to her coworkers. CSW and other group members provided patient with emotional support and encouragement.   Tilden Fossa, MSW, [place] Clinical Social Worker [facility] [phone]

## 2015-06-20 NOTE — BHH Suicide Risk Assessment (Signed)
[facility] Admission Suicide Risk Assessment   Nursing information obtained from:   patient and chart Demographic factors:   35 year old female, married x 2  Current Mental Status:   see below  Loss Factors:   not currently having custody of her children Historical Factors:   recent psychiatric admission for depression and anxiety  Risk Reduction Factors:   living with  A family member, support, resilience   Total Time spent with patient: 45 minutes Principal Problem: MDD  Diagnosis:   Patient Active Problem List   Diagnosis Date Noted  . Major depressive disorder ([place]) [F32.9] 06/19/2015  . Severe episode of recurrent major depressive disorder, without psychotic features ([place]) [F33.2]   . MDD (major depressive disorder) ([place]) [F32.9] 06/12/2015     Continued Clinical Symptoms:  Alcohol Use Disorder Identification Test Final Score (AUDIT): 1 The "Alcohol Use Disorders Identification Test", Guidelines for Use in Primary Care, Second Edition.  World Pharmacologist [facility]). Score between 0-7:  no or low risk or alcohol related problems. Score between 8-15:  moderate risk of alcohol related problems. Score between 16-19:  high risk of alcohol related problems. Score 20 or above:  warrants further diagnostic evaluation for alcohol dependence and treatment.   CLINICAL FACTORS:  Patient is a 35 years old married female, known to our service from recent inpatient psychiatric admission  ( from 4/25- 06/16/15) . At the time she was admitted for worsening depression and suicidal ideations, which she stated were in the context of severe family stressors- her exhusband currently has her three children and does not want to allow patient's daughter to return to live with patient. Patient reports long history of depression, has had prior admissions for depression, suicidal ideations/attempts by OD, and also stresses history of anxiety, excessive worrying . In the past has been diagnosed with Bipolar  Spectrum Disorder, but does not endorse any clear history of mania, hypomania- does report  Brief mood swings. She denies alcohol or drug abuse history States that when she was discharged she was doing well, feeling " good ", but that she felt acutely anxious and upset due to ongoing family stressor as above, and because her outpatient provider " took me off my anxiety medications " ( as per patient, did not renew Klonopin or Vistaril). Patient reports she felt acutely anxious, suicidal and cut self ( has several superficial cuts, no bleeding , no sutures ) on forearm, leading to re-admission. States she feels medications she was on were helping ( Prozac, Vistaril, low dose Klonopin as PRN if needed, Seroquel) Dx - MDD, recurrent, no psychotic symptoms. Anxiety  Plan- Inpatient admission- for now continue Seroquel 150 mgrs QHS, Prozac 20 mgrs QDAY , and Vistaril 25 mgrs TID PRN for anxiety as needed    Musculoskeletal: Strength & Muscle Tone: within normal limits Gait & Station: normal Patient leans: N/A  Psychiatric Specialty Exam: ROS no headache, no chest pain, no shortness of breath, no vomiting, no rash  Blood pressure 118/79, pulse 73, temperature 97.8 F (36.6 C), temperature source Oral, resp. rate 18, height 5\' 2"  (1.575 m), weight 131 lb (59.421 kg), last menstrual period 02/19/2007.Body mass index is 23.95 kg/(m^2).  General Appearance: Well Groomed  Engineer, water::  Good  Speech:  Normal Rate  Volume:  Normal  Mood:  Anxious and Depressed  Affect:  Congruent  Thought Process:  Linear  Orientation:  Full (Time, Place, and Person)  Thought Content:  no hallucinations, no delusions   Suicidal Thoughts:  No denies suicidal ideations, denies self injurious ideations at this time and contracts for safety on the unit at this time   Homicidal Thoughts:  No  Memory:  recent and remote grossly intact   Judgement:  Fair  Insight:  Fair  Psychomotor Activity:  Normal  Concentration:   Good  Recall:  Good  Fund of Knowledge:Good  Language: Good  Akathisia:  Negative  Handed:  Right  AIMS (if indicated):     Assets:  Communication Skills Desire for Improvement Resilience  Sleep:  Number of Hours: 5.75  Cognition: WNL  ADL's:  Intact    COGNITIVE FEATURES THAT CONTRIBUTE TO RISK:  Closed-mindedness and Loss of executive function    SUICIDE RISK:   Moderate:  Frequent suicidal ideation with limited intensity, and duration, some specificity in terms of plans, no associated intent, good self-control, limited dysphoria/symptomatology, some risk factors present, and identifiable protective factors, including available and accessible social support.  PLAN OF CARE: Patient will be admitted to inpatient psychiatric unit for stabilization and safety. Will provide and encourage milieu participation. Provide medication management and maked adjustments as needed.  Will follow daily.    I certify that inpatient services furnished can reasonably be expected to improve the patient's condition.   Neita Garnet, MD 06/20/2015, 12:45 PM
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
## 2015-06-20 NOTE — Progress Notes (Signed)
Admission Note:  Tara Schmidt has been admitted to the unit. She is now resting in her room. During the assessment she endorsed increased depression since her discharge on 4-28. States she has been compliant with her medications however, her psychiatrist at [facility] had increased her Prozac to 40mg  per day today. She was endorsing increased depression with suicidal thoughts at her office visit today and therefore it was recommended that she admit herself to the hospital. She was scheduled to have her first visit with [facility] on 06-22-2015 however she is currently admitted here for depression. She denies any triggers and states she thinks her prozac is not helping lessen her depression. She endorses crying all day with depressed mood, sadness, feelings of worthlessness and decreases appetite the past few weeks. Denies SI/HI/AVH. Encouragement and support given. Medications administered as prescribed. Continue to monitor Q 15 minutes for patient safety and medication effectiveness.

## 2015-06-20 NOTE — Progress Notes (Signed)
Pt has been observed sitting in the dayroom watching TV with minimal interaction with her peers.  Pt reports she is feeling sad and anxious this evening as it is her husband's birthday today, and although he visited tonight with his son, she can't go out with them to celebrate.  She had a cake mix that she was going to use to make him a birthday cake, but she didn't get to do that.  Writer suggested that she continue in her treatment and look forward to her discharge and plan to bake the cake when she gets home.  Pt agreed.  She requested medication for anxiety which she was given at the beginning of the shift.  Pt denies SI/HI/AVH.  She plans to return home at discharge.  Support and encouragement offered.  Pt pleasant and cooperative on the unit.  Pt makes her needs known to staff.  Discharge plans are in process.  Safety maintained with q15 minute checks.

## 2015-06-21 NOTE — Plan of Care (Signed)
Problem: Alteration in mood Goal: LTG-Pt's behavior demonstrates decreased signs of depression (Patient's behavior demonstrates decreased signs of depression to the point the patient is safe to return home and continue treatment in an outpatient setting)  Outcome: Progressing Pt verbalized decreased depression this morning. Pt engages with peers during the day. Hygiene appropriate for pt and situation.

## 2015-06-21 NOTE — Progress Notes (Addendum)
[facility] MD Progress Note  06/21/2015 1:28 PM Tara Schmidt  MRN:  [id number] Subjective:   Patient reports she is feeling better today, and is hoping to discharge soon. She states she was sad yesterday, because it was her husband's birthday and she missed it by being in the hospital, but today she is feeling "OK".  Denies medication side effects at this time. Objective : I have discussed case with treatment team and have met with patient . Patient reports improved mood and presents with fuller range of affect today. Reports decreased severity of anxiety at this time . As above, currently focused on being discharged soon. She denies medication side effects. No disruptive or agitated behaviors, visible in milieu.  Principal Problem: Major depressive disorder ([place]) Diagnosis:   Patient Active Problem List   Diagnosis Date Noted  . Major depressive disorder ([place]) [F32.9] 06/19/2015  . Severe episode of recurrent major depressive disorder, without psychotic features ([place]) [F33.2]   . MDD (major depressive disorder) ([place]) [F32.9] 06/12/2015   Total Time spent with patient: 20 minutes    Past Medical History:  Past Medical History  Diagnosis Date  . Suicide attempt ([place])     x 3  . Depression   . Anxiety   . Asthma     Past Surgical History  Procedure Laterality Date  . Abdominal hysterectomy     Family History: History reviewed. No pertinent family history.  Social History:  History  Alcohol Use No     History  Drug Use No    Social History   Social History  . Marital Status: Married    Spouse Name: N/A  . Number of Children: N/A  . Years of Education: N/A   Social History Main Topics  . Smoking status: Never Smoker   . Smokeless tobacco: Never Used  . Alcohol Use: No  . Drug Use: No  . Sexual Activity: Yes    Birth Control/ Protection: Surgical   Other Topics Concern  . None   Social History Narrative   Additional Social History:    Pain Medications: Denies  abuse Prescriptions: Prozac 20 mg; Seroquel 150 mg Over the Counter: Denies abuse History of alcohol / drug use?: Yes Longest period of sobriety (when/how long): NA Name of Substance 1: Alcohol 1 - Age of First Use: 22 1 - Amount (size/oz): 1 mixed drink or 1 glass of wine 1 - Frequency: occasionally 1 - Duration: ongoing 1 - Last Use / Amount: 2 weeks ago  Sleep: Good  Appetite:  Good  Current Medications: Current Facility-Administered Medications  Medication Dose Route Frequency Provider Last Rate Last Dose  . acetaminophen (TYLENOL) tablet 650 mg  650 mg Oral Q6H PRN Laverle Hobby, PA-C      . albuterol (PROVENTIL HFA;VENTOLIN HFA) 108 (90 Base) MCG/ACT inhaler 1-2 puff  1-2 puff Inhalation Q6H PRN Jenne Campus, MD      . alum & mag hydroxide-simeth (MAALOX/MYLANTA) 200-200-20 MG/5ML suspension 30 mL  30 mL Oral Q4H PRN Laverle Hobby, PA-C      . FLUoxetine (PROZAC) capsule 20 mg  20 mg Oral Daily Jenne Campus, MD   20 mg at 06/21/15 0819  . hydrOXYzine (ATARAX/VISTARIL) tablet 25 mg  25 mg Oral TID PRN Laverle Hobby, PA-C   25 mg at 06/20/15 1954  . magnesium hydroxide (MILK OF MAGNESIA) suspension 30 mL  30 mL Oral Daily PRN Laverle Hobby, PA-C      . mometasone-formoterol [facility])  100-5 MCG/ACT inhaler 1 puff  1 puff Inhalation BID Jenne Campus, MD   1 puff at 06/21/15 0819  . neomycin-bacitracin-polymyxin (NEOSPORIN) ointment   Topical PRN Jenne Campus, MD   1 application at 02/40/97 [phone]  . QUEtiapine (SEROQUEL) tablet 150 mg  150 mg Oral QHS Laverle Hobby, PA-C   150 mg at 06/20/15 2138    Lab Results: No results found for this or any previous visit (from the past 48 hour(s)).  Blood Alcohol level:  Lab Results  Component Value Date   ETH <5 06/11/2015    Physical Findings: AIMS: Facial and Oral Movements Muscles of Facial Expression: None, normal Lips and Perioral Area: None, normal Jaw: None, normal Tongue: None, normal,Extremity  Movements Upper (arms, wrists, hands, fingers): None, normal Lower (legs, knees, ankles, toes): None, normal, Trunk Movements Neck, shoulders, hips: None, normal, Overall Severity Severity of abnormal movements (highest score from questions above): None, normal Incapacitation due to abnormal movements: None, normal Patient's awareness of abnormal movements (rate only patient's report): No Awareness, Dental Status Current problems with teeth and/or dentures?: No Does patient usually wear dentures?: No  CIWA:  CIWA-Ar Total: 6 COWS:  COWS Total Score: 2  Musculoskeletal: Strength & Muscle Tone: within normal limits Gait & Station: normal Patient leans: N/A  Psychiatric Specialty Exam: ROS denies headache, denies chest pain, denies shortness of breath, denies vomiting   Blood pressure 127/80, pulse 85, temperature 97.5 F (36.4 C), temperature source Oral, resp. rate 16, height '5\' 2"'  (1.575 m), weight 131 lb (59.421 kg), last menstrual period 02/19/2007.Body mass index is 23.95 kg/(m^2).  General Appearance: Well Groomed  Engineer, water::  Good  Speech:  Normal Rate  Volume:  Normal  Mood:  improved, and at this time reports " I am feeling OK"  Affect:  Appropriate and Full Range  Thought Process:  Linear  Orientation:  Full (Time, Place, and Person)  Thought Content:  denies hallucinations, no delusions , not internally preoccupied   Suicidal Thoughts:  No- at this time denies any suicidal ideations, denies any self injurious ideations   Homicidal Thoughts:  No  Memory:  recent and remote grossly intact   Judgement:  Other:  improving  Insight:  improving   Psychomotor Activity:  Normal  Concentration:  Good  Recall:  Good  Fund of Knowledge:Good  Language: Good  Akathisia:  Negative  Handed:  Right  AIMS (if indicated):     Assets:  Communication Skills Desire for Improvement Resilience  ADL's:  Intact  Cognition: WNL  Sleep:  Number of Hours: 6  Assessment - patient  reports improving mood and presents with full range of affect at this time. States anxiety, which has been a major issue for her, is currently better controlled, and feels Vistaril PRNs are helpful and well tolerated . Denies medication side effects. As she improves she is starting to focus on disposition , being discharged soon .  Treatment Plan Summary: Daily contact with patient to assess and evaluate symptoms and progress in treatment, Medication management, Plan inpatient treatment  and medications as below  Encourage group, milieu participation to work on coping skills and symptom reduction Continue Seroquel 150 mgrs QHS for mood disorder, sleep Continue Prozac 20 mgrs QDAY for depression, anxiety Continue Vistaril 25 mgrs Q  6 hours PRN for anxiety as needed Treatment team working on disposition planning  Will obtain HgbA1C, Prolactin, Lipid Panel , as on Seroquel, and also monitor TSH. Neita Garnet, MD 06/21/2015,  1:28 PM

## 2015-06-21 NOTE — Progress Notes (Signed)
Recreation Therapy Notes  Date: 05.03.2017 Time: 9:30am Location: [address] Dayroom   Group Topic: Stress Management  Goal Area(s) Addresses:  Patient will actively participate in stress management techniques presented during session.   Behavioral Response: Engaged, Attentive  Intervention: Stress management techniques  Activity :  Deep Breathing and Progressive Body Relaxation. LRT provided education, instruction and demonstration on practice of Deep Breathing and Progressive Body Relaxation. Patient was asked to participate in technique introduced during session.   Education:  Stress Management, Discharge Planning.   Education Outcome: Acknowledges education  Clinical Observations/Feedback: Patient actively engaged in technique introduced, expressed no concerns and demonstrated ability to practice independently post d/c.   Laureen Ochs Ghassan Coggeshall, LRT/CTRS        Sheza Strickland L 06/21/2015 1:37 PM

## 2015-06-21 NOTE — Plan of Care (Signed)
Problem: Ineffective individual coping Goal: STG: Patient will remain free from self harm Outcome: Progressing Pt safe on the unit at this time     

## 2015-06-21 NOTE — Progress Notes (Signed)
D: Pt presents anxious on approach. Pt verbalized that she was ready to discharge home to celebrate her husband birthday today because she missed celebrating it with him yesterday. Pt noted on her self inventory sheet, depression 5/10. Hopeless 5/10. Anxiety 7/10. Pt reports fair sleep and appetite. No complaints verbalized by pt. Pt compliant with taking meds and attending groups. A: Medications reviewed with pt. Medications administered as ordered per MD. Verbal support provided. Pt encouraged to attend groups. 15 minute checks performed for safety.  R: Pt stated goal "going home well". Pt verbalized understanding of med regimen. Pt receptive to tx.

## 2015-06-21 NOTE — Progress Notes (Signed)
Adult Psychoeducational Group Note  Date:  06/21/2015 Time:  9:50 PM  Group Topic/Focus:  Wrap-Up Group:   The focus of this group is to help patients review their daily goal of treatment and discuss progress on daily workbooks.  Participation Level:  Active  Participation Quality:  Appropriate  Affect:  Appropriate  Cognitive:  Alert  Insight: Appropriate  Engagement in Group:  Engaged  Modes of Intervention:  Discussion  Additional Comments:  Pt rated her day 10/10 because she is looking forward to being discharged tomorrow. Pt also participated in a group activity.   Tara Schmidt 06/21/2015, 9:50 PM

## 2015-06-21 NOTE — Progress Notes (Signed)
D: Pt denies SI/HI/AVH. Pt is pleasant and cooperative. Pt stated she was ok and ready to go.   A: Pt was offered support and encouragement. Pt was given scheduled medications. Pt was encourage to attend groups. Q 15 minute checks were done for safety.   R:Pt attends groups and interacts well with peers and staff. Pt is taking medication. Pt receptive to treatment and safety maintained on unit.

## 2015-06-21 NOTE — Tx Team (Addendum)
Interdisciplinary Treatment Plan Update (Adult) Date: 06/21/2015    Time Reviewed: 9:30 AM  Progress in Treatment: Attending groups: Continuing to assess, patient new to milieu Participating in groups: Continuing to assess, patient new to milieu Taking medication as prescribed: Yes Tolerating medication: Yes Family/Significant other contact made: Yes, CSW spoke with husband Patient understands diagnosis: Yes Discussing patient identified problems/goals with staff: Yes Medical problems stabilized or resolved: Yes Denies suicidal/homicidal ideation: Yes Issues/concerns per patient self-inventory: Yes Other:  New problem(s) identified: N/A  Discharge Plan or Barriers: Home with outpatient services  Reason for Continuation of Hospitalization:  Depression Anxiety Medication Stabilization   Comments: N/A  Estimated length of stay: Discharge anticipated for 5/4.   Patient is a 35 year old female readmitted less than 1 week of discharge for increased depression and SI. She was previously admitted after cutting her forearms w glass and taking an intentional overdose of prescribed medications.Stressors include being taken off of her anxiety medications by new provider at [facility] and being told by her ex-husband/father of her children that he would not bring her 16 year old daughter back home as both daughter and patient requested. Pt diagnosed w Major Depressive Disorder. Patient reports significant social anxiety and irritability while in crowds, has struggled w this issue for quite some time.Husband is supportive and involved. Discharge case management will assist w aftercare referrals for therapy and medication management.   Review of initial/current patient goals per problem list:  1. Goal(s): Patient will participate in aftercare plan   Met: Yes   Target date: 3-5 days post admission date   As evidenced by: Patient will participate within aftercare  plan AEB aftercare provider and housing plan at discharge being identified.  5/3: Goal met. Patient plans to return home to follow up with outpatient services.    2. Goal (s): Patient will exhibit decreased depressive symptoms and suicidal ideations.   Met: Adequate for discharge per MD.    Target date: 3-5 days post admission date   As evidenced by: Patient will utilize self rating of depression at 3 or below and demonstrate decreased signs of depression or be deemed stable for discharge by MD.  5/3: Patient rates depression at 5, denies SI.     5/4: Adequate for discharge per MD. Patient reports improvement in her depression and denies SI.   3. Goal(s): Patient will demonstrate decreased signs and symptoms of anxiety.   Met: Adequate for discharge for discharge per MD.    Target date: 3-5 days post admission date   As evidenced by: Patient will utilize self rating of anxiety at 3 or below and demonstrated decreased signs of anxiety, or be deemed stable for discharge by MD   5/3: Patient rates anxiety at 7.  5/4: Adequate for discharge per MD. Patient reports improvements in her anxiety and reports feeling safe to discharge.    Attendees: Patient:    Family:    Physician: Dr. Cobos 06/21/2015 9:30 AM  Nursing: Jane Ochieng, Patrice White, Elizabeth I., RN 06/21/2015 9:30 AM  Clinical Social Worker:  , LCSW 06/21/2015 9:30 AM  Other: Heather Smart, LCSW  06/21/2015 9:30 AM  Other:  06/21/2015 9:30 AM  Other: Jennifer Clark, Case Manager 06/21/2015 9:30 AM  Other: Aggie Nwoko; May Augustin , NP 06/21/2015 9:30 AM  Other:    Other:               Scribe for Treatment Team:   , LCSW [phone]     

## 2015-06-21 NOTE — BHH Group Notes (Signed)
   [facility] LCSW Aftercare Discharge Planning Group Note  06/21/2015  8:45 AM   Participation Quality: Alert, Appropriate and Oriented  Mood/Affect: Appropriate  Depression Rating: 5  Anxiety Rating: 7  Thoughts of Suicide: Pt denies SI/HI  Will you contract for safety? Yes  Current AVH: Pt denies  Plan for Discharge/Comments: Pt attended discharge planning group and actively participated in group. CSW provided pt with today's workbook. Patient plans to return home to follow up with outpatient services.   Transportation Means: Pt reports access to transportation  Supports: No supports mentioned at this time  Tilden Fossa, MSW, [employer] Clinical Social Worker [employer] [phone]

## 2015-06-22 LAB — LIPID PANEL
CHOL/HDL RATIO: 4.7 ratio
CHOLESTEROL: 174 mg/dL (ref 0–200)
HDL: 37 mg/dL — AB (ref >40)
LDL Cholesterol: 99 mg/dL (ref 0–99)
Triglycerides: 189 mg/dL — ABNORMAL HIGH (ref <150)
VLDL: 38 mg/dL (ref 0–40)

## 2015-06-22 LAB — TSH: TSH: 4.903 u[IU]/mL — ABNORMAL HIGH (ref 0.350–4.500)

## 2015-06-22 MED ORDER — HYDROXYZINE HCL 25 MG PO TABS
25.0000 mg | ORAL_TABLET | Freq: Two times a day (BID) | ORAL | Status: DC
  Filled 2015-06-22 (×8): qty 1

## 2015-06-22 MED ORDER — HYDROXYZINE HCL 25 MG PO TABS: 25.0000 mg | ORAL_TABLET | Freq: Three times a day (TID) | ORAL | Status: DC | PRN

## 2015-06-22 MED ORDER — QUETIAPINE FUMARATE 50 MG PO TABS
150.0000 mg | ORAL_TABLET | Freq: Every day | ORAL | Status: DC
Start: 2015-06-22 — End: 2016-03-13

## 2015-06-22 MED ORDER — FLUOXETINE HCL 20 MG PO CAPS: 20.0000 mg | ORAL_CAPSULE | Freq: Every day | ORAL | Status: DC

## 2015-06-22 MED ORDER — HYDROXYZINE HCL 25 MG PO TABS: 25.0000 mg | ORAL_TABLET | Freq: Two times a day (BID) | ORAL | Status: DC

## 2015-06-22 MED ORDER — ALBUTEROL SULFATE HFA 108 (90 BASE) MCG/ACT IN AERS: 1.0000 | INHALATION_SPRAY | Freq: Four times a day (QID) | RESPIRATORY_TRACT | Status: DC | PRN

## 2015-06-22 NOTE — BHH Suicide Risk Assessment (Signed)
[facility] INPATIENT:  Family/Significant Other Suicide Prevention Education  Suicide Prevention Education:  Education Completed; husband [name] [phone],  (name of family member/significant other) has been identified by the patient as the family member/significant other with whom the patient will be residing, and identified as the person(s) who will aid the patient in the event of a mental health crisis (suicidal ideations/suicide attempt).  With written consent from the patient, the family member/significant other has been provided the following suicide prevention education, prior to the and/or following the discharge of the patient.  The suicide prevention education provided includes the following:  Suicide risk factors  Suicide prevention and interventions  National Suicide Hotline telephone number  [facility] assessment telephone number  [facility] Emergency Assistance [place] and/or Residential Mobile Crisis Unit telephone number  Request made of family/significant other to:  Remove weapons (e.g., guns, rifles, knives), all items previously/currently identified as safety concern.    Remove drugs/medications (over-the-counter, prescriptions, illicit drugs), all items previously/currently identified as a safety concern.  The family member/significant other verbalizes understanding of the suicide prevention education information provided.  The family member/significant other agrees to remove the items of safety concern listed above.  Lyna Laningham, [name] 06/22/2015, 10:50 AM

## 2015-06-22 NOTE — BHH Suicide Risk Assessment (Addendum)
[facility] Discharge Suicide Risk Assessment   Principal Problem: Major depressive disorder [facility]) Discharge Diagnoses:  Patient Active Problem List   Diagnosis Date Noted  . Major depressive disorder ([place]) [F32.9] 06/19/2015  . Severe episode of recurrent major depressive disorder, without psychotic features ([place]) [F33.2]   . MDD (major depressive disorder) ([place]) [F32.9] 06/12/2015    Total Time spent with patient: 30 minutes  Musculoskeletal: Strength & Muscle Tone: within normal limits Gait & Station: normal Patient leans: N/A  Psychiatric Specialty Exam: ROS  Blood pressure 133/83, pulse 79, temperature 98.3 F (36.8 C), temperature source Oral, resp. rate 16, height 5\' 2"  (1.575 m), weight 131 lb (59.421 kg), last menstrual period 02/19/2007.Body mass index is 23.95 kg/(m^2).  General Appearance: Well Groomed  Eye Contact::  Good  Speech:  Normal Rate409  Volume:  Normal  Mood:  Euthymic- at this time denies depression, presents euthymic   Affect:  Full Range  Thought Process:  Linear  Orientation:  Full (Time, Place, and Person)  Thought Content:  denies hallucinations, no delusions   Suicidal Thoughts:  No denies any suicidal or self injurious ideations   Homicidal Thoughts:  No no homicidal or violent ideations   Memory:  recent and remote grossly intact   Judgement:  Other:  improved   Insight:  improved   Psychomotor Activity:  Normal  Concentration:  Good  Recall:  Good  Fund of Knowledge:Good  Language: Good  Akathisia:  Negative  Handed:  Right  AIMS (if indicated):   no abnormal involuntary movements , no akathisia  Assets:  Communication Skills Desire for Improvement Resilience Social Support  Sleep:  Number of Hours: 6.25  Cognition: WNL  ADL's:  Intact   Mental Status Per Nursing Assessment::   On Admission:     Demographic Factors:  35 year old married female , lives with husband, has three children, currently living with her ex husband   Loss  Factors: Her children currently not living with her, has limited access to them  Historical Factors: History of depression, history of anxiety, recent psychiatric admission for depression   Risk Reduction Factors:   Responsible for children under 75 years of age, Sense of responsibility to family, Living with another person, especially a relative, Positive social support and Positive coping skills or problem solving skills  Continued Clinical Symptoms:  At this time patient improved, presents euthymic, full range of affect, no thought disorder, no SI or HI, no psychotic symptoms , future oriented, denies medication side effects, looking forward to returning home  Denies medication side effects * Finds Vistaril to be helpful for anxiety, denies side effects, we have reviewed medication side effects to include sedating potential of Vistaril. Reports good response and tolerance to Seroquel, which she has taken in the past, we reviewed potential for  weight gain, metabolic/ movement disorders  related to Seroquel ,  and precautions of not driving, etc..., if feeling sedated, drowsy * TSH slightly elevated ( 4.9)- will follow up with PCP to recheck, manage if needed   Cognitive Features That Contribute To Risk:  No gross cognitive deficits noted upon discharge. Is alert , attentive, and oriented x 3   Suicide Risk:  Mild:  Suicidal ideation of limited frequency, intensity, duration, and specificity.  There are no identifiable plans, no associated intent, mild dysphoria and related symptoms, good self-control (both objective and subjective assessment), few other risk factors, and identifiable protective factors, including available and accessible social support.  Follow-up Information  Follow up with [facility] On 06/27/2015.   Why:  Assessment on Tuesday May 9th at 3pm (arrive 30 min. early for paperwork) with therapist Crissie Figures who will then set you up with medication management  appointment with Dr. Rosine Door. Bring ID and insurance card to appointment.   Contact information:   [address]  [place], [place] [address]  [phone]      Follow up with [facility] On 06/29/2015.   Why:  Therapy appt with Waymon Amato on Thursday May 11th at 3pm. Bring insurance card to appt and call office if you need to reschedule   Contact information:   [address] [place]  [place], [place] [address]  Phone: [phone]  Fax:  [phone]      Plan Of Care/Follow-up recommendations:  Activity:  as tolerated  Diet:  regular Tests:  NA Other:  See below  Patient is leaving unit in good spirits  Plans tor return home Follow up as above Of note, TSH minimally elevated- we discussed- she will follow up with her PCP to have it repeated, monitored .  Neita Garnet, MD 06/22/2015, 1:23 PM

## 2015-06-22 NOTE — Progress Notes (Signed)
[facility] Group Notes:  (Nursing/MHT/Case Management/Adjunct)  Date:  06/22/2015  Time:  10:05 AM  Type of Therapy:  Nurse Education  Participation Level:  Active  Participation Quality:  Appropriate and Sharing  Affect:  Appropriate  Cognitive:  Alert, Appropriate and Oriented  Insight:  Appropriate  Engagement in Group:  Developing/Improving, Engaged and Supportive  Modes of Intervention:  Discussion, Socialization and Support  Summary of Progress/Problems:Pt shared with group what she had learned while in the hospital. Pt discussed having people to talk to and not being alone. She reports being ready for discharge and not wanting her husband to worry about her.  Mosie Lukes 06/22/2015, 10:05 AM

## 2015-06-22 NOTE — Progress Notes (Signed)
Pt d/c from the hospital. All items returned. D/C instructions given and prescriptions given. Pt denies si and hi. 

## 2015-06-22 NOTE — Progress Notes (Signed)
  [facility] Adult Case Management Discharge Plan :  Will you be returning to the same living situation after discharge:  Yes,  patient plans to return home with family At discharge, do you have transportation home?: Yes,  patient reports access to transportation Do you have the ability to pay for your medications: Yes,  patient will be provided with prescrptions  Release of information consent forms completed and in the chart;  Patient's signature needed at discharge.  Patient to Follow up at: Follow-up Information    Follow up with [facility] On 06/27/2015.   Why:  Assessment on Tuesday May 9th at 3pm (arrive 30 min. early for paperwork) with therapist Crissie Figures who will then set you up with medication management appointment with Dr. Rosine Door. Bring ID and insurance card to appointment.   Contact information:   [address]  [place], [place] [address]  [phone]      Follow up with [facility] On 06/29/2015.   Why:  Therapy appt with Waymon Amato on Thursday May 11th at 3pm. Bring insurance card to appt and call office if you need to reschedule   Contact information:   [address]  [place], [place] [address]  Phone: [phone]  Fax:  [phone]      Next level of care provider has access to [place] and Suicide Prevention discussed: Yes,  with patient and husband  Have you used any form of tobacco in the last 30 days? (Cigarettes, Smokeless Tobacco, Cigars, and/or Pipes): No  Has patient been referred to the Quitline?: N/A patient is not a smoker  Patient has been referred for addiction treatment: Yes  Taima Rada, [name] 06/22/2015, 10:57 AM

## 2015-06-22 NOTE — Discharge Summary (Signed)
Physician Discharge Summary Note  Patient:  Tara Schmidt is an 35 y.o., female MRN:  [id number] DOB:  November 14, 1980 Patient phone:  [phone] (home)  Patient address:   [address] [place] [address],  Total Time spent with patient: 30 minutes  Date of Admission:  06/19/2015 Date of Discharge: 06/22/2015  Reason for Admission:  depression  Principal Problem: Major depressive disorder [facility]) Discharge Diagnoses: Patient Active Problem List   Diagnosis Date Noted  . Major depressive disorder ([place]) [F32.9] 06/19/2015  . Severe episode of recurrent major depressive disorder, without psychotic features ([place]) [F33.2]   . MDD (major depressive disorder) ([place]) [F32.9] 06/12/2015    Past Psychiatric History:  See above noted  Past Medical History:  Past Medical History  Diagnosis Date  . Suicide attempt ([place])     x 3  . Depression   . Anxiety   . Asthma     Past Surgical History  Procedure Laterality Date  . Abdominal hysterectomy     Family History: History reviewed. No pertinent family history. Family Psychiatric  History:  denied Social History:  History  Alcohol Use No     History  Drug Use No    Social History   Social History  . Marital Status: Married    Spouse Name: N/A  . Number of Children: N/A  . Years of Education: N/A   Social History Main Topics  . Smoking status: Never Smoker   . Smokeless tobacco: Never Used  . Alcohol Use: No  . Drug Use: No  . Sexual Activity: Yes    Birth Control/ Protection: Surgical   Other Topics Concern  . None   Social History Narrative   Hospital Course:    Tara Schmidt was admitted for Major depressive disorder [facility]) and crisis management.  She was treated with meds listed below.  Medical problems were identified and treated as needed.  Home medications were restarted as appropriate.  Improvement was monitored by observation and Tara Schmidt daily report of symptom reduction.  Emotional and  mental status was monitored by daily self inventory reports completed by Tara Schmidt and clinical staff.  Patient reported continued improvement, denied any new concerns.  Patient had been compliant on medications and denied side effects.  Support and encouragement was provided.    Patient did well during inpatient stay.  At time of discharge, patient rated both depression and anxiety levels to be manageable and minimal.  Patient was able to identify the triggers of emotional crises and de-stabilizations.  Patient identified the positive things in life that would help in dealing with feelings of loss, depression and unhealthy or abusive tendencies.         Tara Schmidt was evaluated by the treatment team for stability and plans for continued recovery upon discharge.  She was offered further treatment options upon discharge including Residential, Intensive Outpatient and Outpatient treatment.  She will follow up with agencies listed below for medication management and counseling.  Encouraged patient to maintain satisfactory support network and home environment.  Advised to adhere to medication compliance and outpatient treatment follow up.      Tara Schmidt motivation was an integral factor for scheduling further treatment.  Employment, transportation, bed availability, health status, family support, and any pending legal issues were also considered during her hospital stay.  Upon completion of this admission the patient was both mentally and medically stable for discharge denying suicidal/homicidal ideation, auditory/visual/tactile hallucinations, delusional thoughts and paranoia.  Physical Findings: AIMS: Facial and Oral Movements Muscles of Facial Expression: None, normal Lips and Perioral Area: None, normal Jaw: None, normal Tongue: None, normal,Extremity Movements Upper (arms, wrists, hands, fingers): None, normal Lower (legs, knees, ankles, toes): None, normal, Trunk Movements Neck,  shoulders, hips: None, normal, Overall Severity Severity of abnormal movements (highest score from questions above): None, normal Incapacitation due to abnormal movements: None, normal Patient's awareness of abnormal movements (rate only patient's report): No Awareness, Dental Status Current problems with teeth and/or dentures?: No Does patient usually wear dentures?: No  CIWA:  CIWA-Ar Total: 6 COWS:  COWS Total Score: 2  Musculoskeletal: Strength & Muscle Tone: within normal limits Gait & Station: normal Patient leans: N/A  Psychiatric Specialty Exam:  SEE MD SRA Review of Systems  Psychiatric/Behavioral: Negative for suicidal ideas, hallucinations and substance abuse. Depression: improving.  All other systems reviewed and are negative.   Blood pressure 133/83, pulse 79, temperature 98.3 F (36.8 C), temperature source Oral, resp. rate 16, height 5\' 2"  (1.575 m), weight 59.421 kg (131 lb), last menstrual period 02/19/2007.Body mass index is 23.95 kg/(m^2).  Have you used any form of tobacco in the last 30 days? (Cigarettes, Smokeless Tobacco, Cigars, and/or Pipes): No  Has this patient used any form of tobacco in the last 30 days? (Cigarettes, Smokeless Tobacco, Cigars, and/or Pipes) Yes, N/A  Blood Alcohol level:  Lab Results  Component Value Date   ETH <5 [id number]    Metabolic Disorder Labs:  No results found for: HGBA1C, MPG No results found for: PROLACTIN Lab Results  Component Value Date   CHOL 174 06/22/2015   TRIG 189* 06/22/2015   HDL 37* 06/22/2015   CHOLHDL 4.7 06/22/2015   VLDL 38 06/22/2015   LDLCALC 99 06/22/2015    See Psychiatric Specialty Exam and Suicide Risk Assessment completed by Attending Physician prior to discharge.  Discharge destination:  Home  Is patient on multiple antipsychotic therapies at discharge:  No   Has Patient had three or more failed trials of antipsychotic monotherapy by history:  No  Recommended Plan for Multiple  Antipsychotic Therapies: NA     Medication List    STOP taking these medications        clonazePAM 1 MG tablet  Commonly known as:  KLONOPIN     hydrOXYzine 25 MG capsule  Commonly known as:  VISTARIL     mometasone-formoterol 100-5 MCG/ACT Aero  Commonly known as:  DULERA      TAKE these medications      Indication   albuterol 108 (90 Base) MCG/ACT inhaler  Commonly known as:  PROVENTIL HFA;VENTOLIN HFA  Inhale 1-2 puffs into the lungs every 6 (six) hours as needed for wheezing or shortness of breath.   Indication:  Asthma     FLUoxetine 20 MG capsule  Commonly known as:  PROZAC  Take 1 capsule (20 mg total) by mouth daily.   Indication:  Major Depressive Disorder     hydrOXYzine 25 MG tablet  Commonly known as:  ATARAX/VISTARIL  Take 1 tablet (25 mg total) by mouth 2 (two) times daily.   Indication:  Anxiety Neurosis     QUEtiapine 50 MG tablet  Commonly known as:  SEROQUEL  Take 3 tablets (150 mg total) by mouth at bedtime.   Indication:  mood stabilization           Follow-up Information    Follow up with [facility] On 06/27/2015.   Why:  Assessment on Tuesday  May 9th at 3pm (arrive 30 min. early for paperwork) with therapist Crissie Figures who will then set you up with medication management appointment with Dr. Rosine Door. Bring ID and insurance card to appointment.   Contact information:   [address]  [place], [place] [address]  [phone]      Follow up with [facility] On 06/29/2015.   Why:  Therapy appt with Waymon Amato on Thursday May 11th at 3pm. Bring insurance card to appt and call office if you need to reschedule   Contact information:   [address] [place]  [place], [place] [address]  Phone: [phone]  Fax:  [phone]      Follow-up recommendations:  Activity:  as tol Diet:  as tol  Comments:  1.  Take all your medications as prescribed.   2.  Report any adverse side effects to outpatient  provider. 3.  Patient instructed to not use alcohol or illegal drugs while on prescription medicines. 4.  In the event of worsening symptoms, instructed patient to call 911, the crisis hotline or go to nearest emergency room for evaluation of symptoms.  Signed: Janett Labella, NP [facility] 06/22/2015, 3:22 PM   Patient seen, Suicide Assessment Completed.  Disposition Plan Reviewed

## 2015-06-22 NOTE — BHH Group Notes (Signed)
Late Entry from 06/21/15:   [facility] LCSW Group Therapy 06/21/15  1:15 PM Type of Therapy: Group Therapy Participation Level: Active  Participation Quality: Attentive, Sharing and Supportive  Affect: Appropriate  Cognitive: Alert and Oriented  Insight: Developing/Improving and Engaged  Engagement in Therapy: Developing/Improving and Engaged  Modes of Intervention: Clarification, Confrontation, Discussion, Education, Exploration, Limit-setting, Orientation, Problem-solving, Rapport Building, Art therapist, Socialization and Support  Summary of Progress/Problems: The topic for group today was emotional regulation. This group focused on both positive and negative emotion identification and allowed group members to process ways to identify feelings, regulate negative emotions, and find healthy ways to manage internal/external emotions. Group members were asked to reflect on a time when their reaction to an emotion led to a negative outcome and explored how alternative responses using emotion regulation would have benefited them. Group members were also asked to discuss a time when emotion regulation was utilized when a negative emotion was experienced. Patient identified her anxiety as difficulty to control. She identified healthy coping skills such as deep breathing and mindfulness to help. She expressed gratitude to other group members for helping her to to recover. She also discussed her negative self-image. CSW and other group members provided patient with emotional support and encouragement.    Tilden Fossa, MSW, [place] Clinical Social Worker [facility] [phone]

## 2015-06-23 LAB — HEMOGLOBIN A1C
Hgb A1c MFr Bld: 5.4 % (ref 4.8–5.6)
Mean Plasma Glucose: 108 mg/dL

## 2015-06-23 LAB — PROLACTIN: Prolactin: 41.8 ng/mL — ABNORMAL HIGH (ref 4.8–23.3)

## 2015-12-06 ENCOUNTER — Ambulatory Visit (HOSPITAL_COMMUNITY)
Admission: RE | Admit: 2015-12-06 | Discharge: 2015-12-06 | Disposition: A | Discharge status: Home / Self Care | Payer: Medicaid Other | Attending: Psychiatry | Admitting: Psychiatry

## 2015-12-06 DIAGNOSIS — F331 Major depressive disorder, recurrent, moderate: Secondary | ICD-10-CM | POA: Insufficient documentation

## 2015-12-06 DIAGNOSIS — F419 Anxiety disorder, unspecified: Secondary | ICD-10-CM | POA: Insufficient documentation

## 2015-12-06 DIAGNOSIS — Z888 Allergy status to other drugs, medicaments and biological substances status: Secondary | ICD-10-CM | POA: Insufficient documentation

## 2015-12-06 DIAGNOSIS — Z79899 Other long term (current) drug therapy: Secondary | ICD-10-CM | POA: Diagnosis not present

## 2015-12-06 NOTE — H&P (Signed)
Behavioral Health Medical Screening Exam  Tara Schmidt is an 35 y.o. female who was at her therapists today who suggested she come to [facility] for inpatient admission for a couple of days to clear her mind. Pt has been cutting (superficial) on her arms and thighs off and on for a few months but denies suicidal/homicidal tendencies and has no intent or plan.   Total Time spent with patient: 20 minutes  Psychiatric Specialty Exam: Physical Exam  ROS  Last menstrual period 02/19/2007.There is no height or weight on file to calculate BMI. BP 121/73   Pulse 89   Temp 99 F (37.2 C) (Oral)   Resp 16   LMP 02/19/2007 (Approximate)   SpO2 100%   General Appearance: Casual and Fairly Groomed  Eye Contact:  Good  Speech:  Clear and Coherent and Normal Rate  Volume:  Normal  Mood:  Anxious and Depressed  Affect:  Congruent and Depressed  Thought Process:  Coherent, Goal Directed and Linear  Orientation:  Full (Time, Place, and Person)  Thought Content:  Illogical  Suicidal Thoughts:  No  Homicidal Thoughts:  No  Memory:  Immediate;   Good Recent;   Good Remote;   Fair  Judgement:  Fair  Insight:  Fair  Psychomotor Activity:  Normal  Concentration: Concentration: Good and Attention Span: Good  Recall:  Good  Fund of Knowledge:Good  Language: Good  Akathisia:  No  Handed:  Right  AIMS (if indicated):     Assets:  Communication Skills Desire for Improvement Financial Resources/Insurance Housing Physical Health Social Support Transportation  Sleep:       Musculoskeletal: Strength & Muscle Tone: within normal limits Gait & Station: normal Patient leans: N/A  Last menstrual period 02/19/2007. BP 121/73   Pulse 89   Temp 99 F (37.2 C) (Oral)   Resp 16   LMP 02/19/2007 (Approximate)   SpO2 100%    Recommendations:  Based on my evaluation the patient does not appear to have an emergency medical condition.  Pt does not meet inpatient psychiatric admission criteria  Ethelene Hal, NP 12/06/2015, 2:26 PM
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
## 2016-03-13 ENCOUNTER — Encounter (HOSPITAL_COMMUNITY): Payer: Self-pay | Admitting: Behavioral Health

## 2016-03-13 ENCOUNTER — Inpatient Hospital Stay (HOSPITAL_COMMUNITY)
Admission: EM | Admit: 2016-03-13 | Discharge: 2016-03-16 | DRG: 885 | Disposition: A | Discharge status: Home / Self Care | Payer: Medicaid Other | Source: Intra-hospital | Attending: Psychiatry | Admitting: Psychiatry

## 2016-03-13 DIAGNOSIS — Z888 Allergy status to other drugs, medicaments and biological substances status: Secondary | ICD-10-CM | POA: Diagnosis not present

## 2016-03-13 DIAGNOSIS — Z7951 Long term (current) use of inhaled steroids: Secondary | ICD-10-CM

## 2016-03-13 DIAGNOSIS — F322 Major depressive disorder, single episode, severe without psychotic features: Secondary | ICD-10-CM | POA: Diagnosis not present

## 2016-03-13 DIAGNOSIS — J45909 Unspecified asthma, uncomplicated: Secondary | ICD-10-CM | POA: Diagnosis present

## 2016-03-13 DIAGNOSIS — Z915 Personal history of self-harm: Secondary | ICD-10-CM

## 2016-03-13 DIAGNOSIS — F332 Major depressive disorder, recurrent severe without psychotic features: Principal | ICD-10-CM | POA: Diagnosis present

## 2016-03-13 DIAGNOSIS — Z90710 Acquired absence of both cervix and uterus: Secondary | ICD-10-CM | POA: Diagnosis not present

## 2016-03-13 DIAGNOSIS — T1491XA Suicide attempt, initial encounter: Secondary | ICD-10-CM | POA: Diagnosis not present

## 2016-03-13 DIAGNOSIS — Z91013 Allergy to seafood: Secondary | ICD-10-CM | POA: Diagnosis not present

## 2016-03-13 DIAGNOSIS — F329 Major depressive disorder, single episode, unspecified: Secondary | ICD-10-CM | POA: Diagnosis present

## 2016-03-13 DIAGNOSIS — X789XXA Intentional self-harm by unspecified sharp object, initial encounter: Secondary | ICD-10-CM | POA: Diagnosis not present

## 2016-03-13 MED ORDER — TRAZODONE HCL 50 MG PO TABS
50.0000 mg | ORAL_TABLET | Freq: Every day | ORAL | Status: DC
  Filled 2016-03-13 (×2): qty 1

## 2016-03-13 MED ORDER — NICOTINE 21 MG/24HR TD PT24
21.0000 mg | MEDICATED_PATCH | Freq: Every day | TRANSDERMAL | Status: DC
  Filled 2016-03-13 (×2): qty 1

## 2016-03-13 MED ORDER — HYDROXYZINE HCL 25 MG PO TABS
25.0000 mg | ORAL_TABLET | Freq: Four times a day (QID) | ORAL | Status: DC | PRN
  Administered 2016-03-13: 25 mg via ORAL
  Filled 2016-03-13: qty 1

## 2016-03-13 MED ORDER — ACETAMINOPHEN 325 MG PO TABS
650.0000 mg | ORAL_TABLET | Freq: Four times a day (QID) | ORAL | Status: DC | PRN
  Administered 2016-03-14: 650 mg via ORAL
  Filled 2016-03-13: qty 2

## 2016-03-13 MED ORDER — TRAZODONE HCL 50 MG PO TABS
50.0000 mg | ORAL_TABLET | Freq: Every evening | ORAL | Status: DC | PRN
  Administered 2016-03-13 – 2016-03-15 (×3): 50 mg via ORAL
  Filled 2016-03-13 (×3): qty 1

## 2016-03-13 MED ORDER — MAGNESIUM HYDROXIDE 400 MG/5ML PO SUSP: 30.0000 mL | Freq: Every day | ORAL | Status: DC | PRN

## 2016-03-13 MED ORDER — CLONAZEPAM 0.5 MG PO TABS
0.5000 mg | ORAL_TABLET | Freq: Three times a day (TID) | ORAL | Status: DC | PRN
  Administered 2016-03-14 – 2016-03-15 (×3): 0.5 mg via ORAL
  Filled 2016-03-13 (×3): qty 1

## 2016-03-13 MED ORDER — QUETIAPINE FUMARATE 100 MG PO TABS
100.0000 mg | ORAL_TABLET | Freq: Every day | ORAL | Status: DC
  Filled 2016-03-13 (×3): qty 1

## 2016-03-13 MED ORDER — MOMETASONE FURO-FORMOTEROL FUM 100-5 MCG/ACT IN AERO
2.0000 | INHALATION_SPRAY | Freq: Two times a day (BID) | RESPIRATORY_TRACT | Status: DC
  Administered 2016-03-14 – 2016-03-16 (×5): 2 via RESPIRATORY_TRACT
  Filled 2016-03-13: qty 8.8

## 2016-03-13 MED ORDER — ALBUTEROL SULFATE HFA 108 (90 BASE) MCG/ACT IN AERS
1.0000 | INHALATION_SPRAY | Freq: Four times a day (QID) | RESPIRATORY_TRACT | Status: DC | PRN
  Filled 2016-03-13: qty 6.7

## 2016-03-13 MED ORDER — IBUPROFEN 600 MG PO TABS
600.0000 mg | ORAL_TABLET | Freq: Four times a day (QID) | ORAL | Status: DC | PRN
  Administered 2016-03-13 – 2016-03-15 (×3): 600 mg via ORAL
  Filled 2016-03-13 (×3): qty 1

## 2016-03-13 MED ORDER — ALUM & MAG HYDROXIDE-SIMETH 200-200-20 MG/5ML PO SUSP: 30.0000 mL | ORAL | Status: DC | PRN
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
## 2016-03-13 NOTE — Tx Team (Addendum)
Initial Treatment Plan 03/13/2016 1:58 PM Tara Schmidt [id number]    PATIENT STRESSORS: Marital or family conflict   PATIENT STRENGTHS: Ability for insight Capable of independent living   PATIENT IDENTIFIED PROBLEMS: "Depression"  "cutting"  Risk for suicide                 DISCHARGE CRITERIA:  Ability to meet basic life and health needs Adequate post-discharge living arrangements Motivation to continue treatment in a less acute level of care  PRELIMINARY DISCHARGE PLAN: Attend aftercare/continuing care group Attend PHP/IOP  PATIENT/FAMILY INVOLVEMENT: This treatment plan has been presented to and reviewed with the patient, Tara Schmidt, and/or family member.  The patient and family have been given the opportunity to ask questions and make suggestions.  Marissa Calamity, RN 03/13/2016, 1:58 PM

## 2016-03-13 NOTE — Progress Notes (Signed)
Adult Psychoeducational Group Note  Date:  03/13/2016 Time:  8:52 PM  Group Topic/Focus:  Wrap-Up Group:   The focus of this group is to help patients review their daily goal of treatment and discuss progress on daily workbooks.  Participation Level:  Active  Participation Quality:  Appropriate  Affect:  Appropriate  Cognitive:  Appropriate  Insight: Appropriate  Engagement in Group:  Engaged  Modes of Intervention:  Discussion  Additional Comments:  Patient attended group and said that her day was a 5.  Something positive about the day was there was still nice guys around.  Tara Schmidt [name] [id number], 8:52 PM
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
## 2016-03-14 DIAGNOSIS — F329 Major depressive disorder, single episode, unspecified: Secondary | ICD-10-CM

## 2016-03-14 DIAGNOSIS — T1491XA Suicide attempt, initial encounter: Secondary | ICD-10-CM | POA: Diagnosis present

## 2016-03-14 DIAGNOSIS — Z9109 Other allergy status, other than to drugs and biological substances: Secondary | ICD-10-CM

## 2016-03-14 DIAGNOSIS — X789XXA Intentional self-harm by unspecified sharp object, initial encounter: Secondary | ICD-10-CM

## 2016-03-14 DIAGNOSIS — Z91040 Latex allergy status: Secondary | ICD-10-CM

## 2016-03-14 MED ORDER — QUETIAPINE FUMARATE 50 MG PO TABS
150.0000 mg | ORAL_TABLET | Freq: Every day | ORAL | Status: DC
  Administered 2016-03-14 – 2016-03-15 (×2): 150 mg via ORAL
  Filled 2016-03-14 (×4): qty 3

## 2016-03-14 MED ORDER — POTASSIUM CHLORIDE CRYS ER 20 MEQ PO TBCR
40.0000 meq | EXTENDED_RELEASE_TABLET | Freq: Every day | ORAL | Status: AC
  Administered 2016-03-14 – 2016-03-15 (×2): 40 meq via ORAL
  Filled 2016-03-14 (×3): qty 2

## 2016-03-14 NOTE — BHH Group Notes (Signed)
[facility] [employer] Group Therapy 03/14/2016 1:15pm  Type of Therapy: Mental Health Association Presentation  Participation Level: Active  Participation Quality: Attentive  Affect: Appropriate  Cognitive: Oriented  Insight: Developing/Improving  Engagement in Therapy: Engaged  Modes of Intervention: Discussion, Education and Socialization  Summary of Progress/Problems: Mental Health Association ([place]) Speaker came to talk about his personal journey with substance abuse and addiction. The pt processed ways by which to relate to the speaker. [place] speaker provided handouts and educational information pertaining to groups and services offered by the [facility]. Pt was engaged in speaker's presentation and was receptive to resources provided.    Adriana Reams, LCSW 03/14/2016 1:17 PM

## 2016-03-14 NOTE — Progress Notes (Signed)
NUTRITION ASSESSMENT  Pt identified as at risk on the Malnutrition Screen Tool  INTERVENTION: 1. Educated patient on the importance of nutrition and encouraged intake of food and beverages. 2. Discussed weight goals. 3. Supplements: none at this time.  NUTRITION DIAGNOSIS: Unintentional weight loss related to sub-optimal intake as evidenced by pt report.   Goal: Pt to meet >/= 90% of their estimated nutrition needs.  Monitor:  PO intake  Assessment:  Pt admitted from [facility] with SI with a plan to cut her throat. Pt had superficial lacerations on her throat when she arrived to the hospital.   Per chart review, pt has lost 5 lbs (3.8% body weight) in the past 6 months which is not significant for time frame.  Continue to encourage PO intakes of meals and snacks.   36 y.o. female  Height: Ht Readings from Last 1 Encounters:  03/13/16 5' 1.81" (1.57 m)    Weight: Wt Readings from Last 1 Encounters:  03/13/16 126 lb (57.2 kg)    Weight Hx: Wt Readings from Last 10 Encounters:  03/13/16 126 lb (57.2 kg)  06/19/15 131 lb (59.4 kg)  06/12/15 130 lb (59 kg)  06/11/15 133 lb (60.3 kg)    BMI:  Body mass index is 23.19 kg/m. Pt meets criteria for normal weight based on current BMI.  Estimated Nutritional Needs: Kcal: 25-30 kcal/kg Protein: > 1 gram protein/kg Fluid: 1 ml/kcal  Diet Order: Diet heart healthy/carb modified Room service appropriate? Yes; Fluid consistency: Thin Pt is also offered choice of unit snacks mid-morning and mid-afternoon.  Pt is eating as desired.   Lab results and medications reviewed.     Jarome Matin, MS, RD, LDN, [facility] Inpatient Clinical Dietitian Pager # [phone] After hours/weekend pager # [phone]
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
## 2016-03-14 NOTE — BHH Group Notes (Signed)
[facility] Group Notes:  Orientation Group   Date:  03/14/2016  Time:  11:18 AM  Type of Therapy:  Psychoeducational Skills  Participation Level:  Active  Participation Quality:  Attentive  Affect:  Blunted  Cognitive:  Appropriate  Insight:  Improving  Engagement in Group:  Engaged  Modes of Intervention:  Discussion and Education  Summary of Progress/Problems: Lahari attended group and was engaged throughout.   Tamsin Nader E 03/14/2016, 11:18 AM
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
## 2016-03-14 NOTE — Progress Notes (Signed)
Adult Psychoeducational Group Note  Date:  03/14/2016 Time:  9:39 PM  Group Topic/Focus:  Wrap-Up Group:   The focus of this group is to help patients review their daily goal of treatment and discuss progress on daily workbooks.  Participation Level:  Active  Participation Quality:  Appropriate  Affect:  Appropriate  Cognitive:  Appropriate  Insight: Appropriate  Engagement in Group:  Engaged  Modes of Intervention:  Discussion  Additional Comments:  Patient attended group and said that her day was a 87. Her goal for tomorrow is to remain in control of her emotions.   Tara Schmidt W Ade Stmarie 0000000, 9:39 PM
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
## 2016-03-14 NOTE — Progress Notes (Signed)
Patient ID: Tara Schmidt, female   DOB: 19-Jul-1980, 36 y.o.   MRN: [id number]  DAR: Pt. Denies SI/HI and A/V Hallucinations. She reports sleep is fair, appetite is poor, energy level is normal, and concentration is good. She rates depression 1/10, hopelessness 1/10, and anxiety 2/10. She reports a headache throughout the day and received PRN medication. Her BP has been elevated until this afternoon which may be related to her headache. Support and encouragement provided to the patient. Scheduled medications administered to patient per physician's orders. Patient is cooperative with this Probation officer and pleasant upon interaction. She is seen in the milieu and is attending some groups. Q15 minute checks are maintained for safety.

## 2016-03-15 MED ORDER — FLUOXETINE HCL 20 MG PO CAPS
20.0000 mg | ORAL_CAPSULE | Freq: Every day | ORAL | Status: DC
Start: 2016-03-15 — End: 2016-03-17
  Administered 2016-03-15 – 2016-03-16 (×2): 20 mg via ORAL
  Filled 2016-03-15 (×4): qty 1

## 2016-03-15 MED ORDER — BACITRACIN-NEOMYCIN-POLYMYXIN 400-5-5000 EX OINT
TOPICAL_OINTMENT | CUTANEOUS | Status: DC | PRN
  Administered 2016-03-15: 1 via TOPICAL

## 2016-03-15 MED ORDER — BACITRACIN-NEOMYCIN-POLYMYXIN 400-5-5000 EX OINT
TOPICAL_OINTMENT | CUTANEOUS | Status: AC
  Administered 2016-03-15: 17:00:00
  Filled 2016-03-15: qty 1

## 2016-03-15 NOTE — Plan of Care (Signed)
Problem: Medication: Goal: Compliance with prescribed medication regimen will improve Outcome: Progressing Pt has been compliant with scheduled medications tonight.

## 2016-03-15 NOTE — Progress Notes (Addendum)
Adult Psychoeducational Group Note  Date:  03/15/2016 Time:  9:30 PM  Group Topic/Focus:  Wrap-Up Group:   The focus of this group is to help patients review their daily goal of treatment and discuss progress on daily workbooks.  Participation Level:  Active  Participation Quality:  Appropriate  Affect:  Appropriate  Cognitive:  Appropriate  Insight: Appropriate  Engagement in Group:  Engaged  Modes of Intervention:  Discussion  Additional Comments:  Patient said that her day was a 39.  She was excited because she met lots of new friends. Tara Schmidt [id number], 9:30 PM

## 2016-03-15 NOTE — Progress Notes (Signed)
  [facility] Adult Case Management Discharge Plan :  Will you be returning to the same living situation after discharge:  Yes,  Pt will return home with family At discharge, do you have transportation home?: Yes,  Pt's mother to pick up Do you have the ability to pay for your medications: Yes,  Pt provided with prescriptions  Release of information consent forms completed and in the chart;  Patient's signature needed at discharge.  Patient to Follow up at: Follow-up Information    [facility] Follow up on 04/05/2016.   Why:  at 2:40pm with Dr. Rosine Door for medication management. Contact information: [address], [place], [place] [address] Phone: [phone] Fax: [facility] up.   Why:  Please use the [facility] within 1-3 days of discharge to be established for therapy services. Walk-in hours are Tuesdays (8am-12pm) and Thursdays (12pm-4pm). Contact information: [address] [place] [address] [phone] (Main Office- they can give you the number to the [facility] office)          Next level of care provider has access to [place] and Suicide Prevention discussed: Yes,  with father; see SPE note  Have you used any form of tobacco in the last 30 days? (Cigarettes, Smokeless Tobacco, Cigars, and/or Pipes): No  Has patient been referred to the Quitline?: N/A patient is not a smoker  Patient has been referred for addiction treatment: Yes  Gladstone Lighter 03/15/2016, 12:28 PM

## 2016-03-15 NOTE — [facility] Group Notes (Signed)
[facility] LCSW Group Therapy 03/15/2016 1:15pm  Type of Therapy: Group Therapy- Feelings Around Relapse and Recovery  Participation Level: Active   Participation Quality:  Appropriate  Affect:  Appropriate  Cognitive: Alert and Oriented   Insight:  Developing   Engagement in Therapy: Developing/Improving and Engaged   Modes of Intervention: Clarification, Confrontation, Discussion, Education, Exploration, Limit-setting, Orientation, Problem-solving, Rapport Building, Art therapist, Socialization and Support  Summary of Progress/Problems: The topic for today was feelings about relapse. The group discussed what relapse prevention is to them and identified triggers that they are on the path to relapse. Members also processed their feeling towards relapse and were able to relate to common experiences. Group also discussed coping skills that can be used for relapse prevention.  Pt participated actively in group discussion. She identified warning signs and triggers related to unhealthy behaviors such as isolation and self-harm. Pt expresses that she feels hopeful after this recent relapse because she has been able to develop a plan to avoid relapse in the future.    Therapeutic Modalities:   Cognitive Behavioral Therapy Solution-Focused Therapy Assertiveness Training Relapse Prevention Therapy    Tara Schmidt [phone] 03/15/2016 3:27 PM

## 2016-03-15 NOTE — Progress Notes (Signed)
D: Patient pleasant and cooperative with care this shift and is noted to interact well with peers in the milieu. Pt does, however complain of anxiety, which was relieved by prn medication. A: Encourage staff/peer interaction, medication compliance, and group participation. Administer medications as ordered, maintain Q 15 minute safety checks. R: Pt compliant with medications and attended group session. Pt denies SI at this time and verbally contracts for safety. No signs/symptoms of distress noted.

## 2016-03-15 NOTE — Progress Notes (Signed)
D: Pt presents with anxious affect and mood.  She describes her day as "great" and reports "I get to go home tomorrow."  Regarding her care at [facility], pt states "I actually believe I got it this time."  She reports she feels safe to discharge tomorrow.   She plans to "continue care with [facility]."  Denies SI/HI, denies hallucinations, reports right hand pain of 8/10.  Pt attended evening group tonight.   A: Introduced self to pt.  Support and encouragement provided.  Medication administered per order.  PRN medication administered for anxiety, pain, and sleep.  Q15 minute safety checks performed.  R: Pt is compliant with medications.  She is safe on the unit.  Pt verbally contracts for safety.

## 2016-03-15 NOTE — Progress Notes (Signed)
Recreation Therapy Notes  Date: 03/15/16 Time: 0930 Location: [address] Group Room  Group Topic: Stress Management  Goal Area(s) Addresses:  Patient will verbalize importance of using healthy stress management.  Patient will identify positive emotions associated with healthy stress management.   Intervention: Stress Management  Activity :  Progressive Muscle Relaxation.  LRT introduced the stress management technique of progressive muscle relaxation.  LRT read script to allow patients to engage fully in the activity.  Patients were to follow along while LRT read script to participate in the technique.  Education:  Stress Management, Discharge Planning.   Education Outcome: Acknowledges edcuation/In group clarification offered/Needs additional education  Clinical Observations/Feedback: Pt did not attend group.   Victorino Sparrow, LRT/CTRS         Victorino Sparrow A 03/15/2016 11:56 AM

## 2016-03-15 NOTE — Progress Notes (Signed)
Adult Psychoeducational Group Note  Date:  03/15/2016 Time:  11:53 AM  Group Topic/Focus:  Identifying Needs:   The focus of this group is to help patients identify their personal needs that have been historically problematic and identify healthy behaviors to address their needs.  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  Pt states that she is working on getting well and staying focused on the present with continued therapy.  Jaedan Schuman R Bryson Gavia 03/15/2016, 11:53 AM
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
## 2016-03-15 NOTE — Tx Team (Signed)
Interdisciplinary Treatment and Diagnostic Plan Update  03/15/2016 Time of Session: 11:49 AM  Tara Schmidt MRN: [id number]  Principal Diagnosis: Suicide attempt  Secondary Diagnoses: Principal Problem:   Suicide attempt Active Problems:   Major depressive disorder without psychotic features   Current Medications:  Current Facility-Administered Medications  Medication Dose Route Frequency Provider Last Rate Last Dose  . acetaminophen (TYLENOL) tablet 650 mg  650 mg Oral Q6H PRN Encarnacion Slates, NP   650 mg at 03/14/16 1003  . albuterol (PROVENTIL HFA;VENTOLIN HFA) 108 (90 Base) MCG/ACT inhaler 1-2 puff  1-2 puff Inhalation Q6H PRN Encarnacion Slates, NP      . alum & mag hydroxide-simeth (MAALOX/MYLANTA) 200-200-20 MG/5ML suspension 30 mL  30 mL Oral Q4H PRN Encarnacion Slates, NP      . clonazePAM Bobbye [place]) tablet 0.5 mg  0.5 mg Oral TID PRN Jenne Campus, MD   0.5 mg at 03/14/16 2045  . FLUoxetine (PROZAC) capsule 20 mg  20 mg Oral Daily Myer Peer Cobos, MD      . hydrOXYzine (ATARAX/VISTARIL) tablet 25 mg  25 mg Oral Q6H PRN Encarnacion Slates, NP   25 mg at 03/13/16 2104  . ibuprofen (ADVIL,MOTRIN) tablet 600 mg  600 mg Oral Q6H PRN Encarnacion Slates, NP   600 mg at 03/14/16 0815  . magnesium hydroxide (MILK OF MAGNESIA) suspension 30 mL  30 mL Oral Daily PRN Encarnacion Slates, NP      . mometasone-formoterol (DULERA) 100-5 MCG/ACT inhaler 2 puff  2 puff Inhalation BID Encarnacion Slates, NP   2 puff at 03/15/16 0092  . QUEtiapine (SEROQUEL) tablet 150 mg  150 mg Oral QHS Jenne Campus, MD   150 mg at 03/14/16 2045  . traZODone (DESYREL) tablet 50 mg  50 mg Oral QHS PRN Jenne Campus, MD   50 mg at 03/14/16 2045    PTA Medications: Prescriptions Prior to Admission  Medication Sig Dispense Refill Last Dose  . cephALEXin (KEFLEX) 500 MG capsule Take 500 mg by mouth 2 (two) times daily. Take for 7 days starting on 03/12/16.  0 03/12/2016  . clonazePAM (KLONOPIN) 1 MG tablet Take 1 mg by mouth 2 (two)  times daily.   03/13/2016  . FLUoxetine (PROZAC) 20 MG capsule Take 1 capsule (20 mg total) by mouth daily. 30 capsule 0 03/12/2016  . hydrOXYzine (ATARAX/VISTARIL) 50 MG tablet Take 50 mg by mouth 2 (two) times daily as needed for anxiety.    03/12/2016  . mometasone-formoterol (DULERA) 100-5 MCG/ACT AERO Inhale 2 puffs into the lungs 2 (two) times daily.   03/12/2016  . QUEtiapine (SEROQUEL) 300 MG tablet Take 300 mg by mouth at bedtime.   03/11/2016  . albuterol (PROVENTIL HFA;VENTOLIN HFA) 108 (90 Base) MCG/ACT inhaler Inhale 1-2 puffs into the lungs every 6 (six) hours as needed for wheezing or shortness of breath.   03/11/2016    Treatment Modalities: Medication Management, Group therapy, Case management,  1 to 1 session with clinician, Psychoeducation, Recreational therapy.  Patient Stressors: Marital or family conflict  Patient Strengths: Ability for insight Capable of independent living  Physician Treatment Plan for Primary Diagnosis: Suicide attempt Long Term Goal(s): Improvement in symptoms so as ready for discharge  Short Term Goals: Ability to verbalize feelings will improve Ability to disclose and discuss suicidal ideas Ability to demonstrate self-control will improve Ability to identify and develop effective coping behaviors will improve Ability to verbalize feelings will improve Ability to disclose  and discuss suicidal ideas Ability to demonstrate self-control will improve Ability to identify and develop effective coping behaviors will improve Ability to maintain clinical measurements within normal limits will improve  Medication Management: Evaluate patient's response, side effects, and tolerance of medication regimen.  Therapeutic Interventions: 1 to 1 sessions, Unit Group sessions and Medication administration.  Evaluation of Outcomes: Adequate for Discharge  Physician Treatment Plan for Secondary Diagnosis: Principal Problem:   Suicide attempt Active Problems:    Major depressive disorder without psychotic features   Long Term Goal(s): Improvement in symptoms so as ready for discharge  Short Term Goals: Ability to verbalize feelings will improve Ability to disclose and discuss suicidal ideas Ability to demonstrate self-control will improve Ability to identify and develop effective coping behaviors will improve Ability to verbalize feelings will improve Ability to disclose and discuss suicidal ideas Ability to demonstrate self-control will improve Ability to identify and develop effective coping behaviors will improve Ability to maintain clinical measurements within normal limits will improve  Medication Management: Evaluate patient's response, side effects, and tolerance of medication regimen.  Therapeutic Interventions: 1 to 1 sessions, Unit Group sessions and Medication administration.  Evaluation of Outcomes: Adequate for Discharge   RN Treatment Plan for Primary Diagnosis: Suicide attempt Long Term Goal(s): Knowledge of disease and therapeutic regimen to maintain health will improve  Short Term Goals: Ability to verbalize feelings will improve, Ability to disclose and discuss suicidal ideas and Ability to identify and develop effective coping behaviors will improve  Medication Management: RN will administer medications as ordered by provider, will assess and evaluate patient's response and provide education to patient for prescribed medication. RN will report any adverse and/or side effects to prescribing provider.  Therapeutic Interventions: 1 on 1 counseling sessions, Psychoeducation, Medication administration, Evaluate responses to treatment, Monitor vital signs and CBGs as ordered, Perform/monitor CIWA, COWS, AIMS and Fall Risk screenings as ordered, Perform wound care treatments as ordered.  Evaluation of Outcomes: Adequate for Discharge   LCSW Treatment Plan for Primary Diagnosis: Suicide attempt Long Term Goal(s): Safe transition  to appropriate next level of care at discharge, Engage patient in therapeutic group addressing interpersonal concerns.  Short Term Goals: Engage patient in aftercare planning with referrals and resources, Identify triggers associated with mental health/substance abuse issues and Increase skills for wellness and recovery  Therapeutic Interventions: Assess for all discharge needs, 1 to 1 time with Social worker, Explore available resources and support systems, Assess for adequacy in community support network, Educate family and significant other(s) on suicide prevention, Complete Psychosocial Assessment, Interpersonal group therapy.  Evaluation of Outcomes: Not Met   Progress in Treatment: Attending groups: Yes  Participating in groups: Yes Taking medication as prescribed: Yes, MD continues to assess for medication changes as needed Toleration medication: Yes, no side effects reported at this time Family/Significant other contact made: Yes with father Patient understands diagnosis: Yes AEB willingness to seek treatment Discussing patient identified problems/goals with staff: Yes Medical problems stabilized or resolved: Yes Denies suicidal/homicidal ideation: Yes Issues/concerns per patient self-inventory: None Other: N/A  New problem(s) identified: None identified at this time.   New Short Term/Long Term Goal(s): None identified at this time.   Discharge Plan or Barriers: Pt will return home and follow-up with outpatient services.   Reason for Continuation of Hospitalization: Medication stabilization  Estimated Length of Stay: 1 day  Attendees: Patient: 03/15/2016  11:49 AM  Physician: Dr. Parke Poisson 03/15/2016  11:49 AM  Nursing: Drake Leach, Sandre Kitty, RN 03/15/2016  11:49  AM  RN Care Manager: Lars Pinks, RN 03/15/2016  11:49 AM  Social Worker: Adriana Reams, LCSW 03/15/2016  11:49 AM  Recreational Therapist:  03/15/2016  11:49 AM  Other: Lindell Spar, NP; Samuel Jester, NP  03/15/2016  11:49 AM  Other:  03/15/2016  11:49 AM  Other: 03/15/2016  11:49 AM    Scribe for Treatment Team: Gladstone Lighter, LCSW 03/15/2016 11:49 AM

## 2016-03-15 NOTE — [facility] Suicide Risk Assessment (Signed)
[place] [place] INPATIENT:  Family/Significant Other Suicide Prevention Education  Suicide Prevention Education:  Education Completed; Rutherford Nail, Pt's father [phone], has been identified by the patient as the family member/significant other with whom the patient will be residing, and identified as the person(s) who will aid the patient in the event of a mental health crisis (suicidal ideations/suicide attempt).  With written consent from the patient, the family member/significant other has been provided the following suicide prevention education, prior to the and/or following the discharge of the patient.  The suicide prevention education provided includes the following:  Suicide risk factors  Suicide prevention and interventions  National Suicide Hotline telephone number  [facility] assessment telephone number  [facility] Emergency Assistance [place] and/or Residential Mobile Crisis Unit telephone number  Request made of family/significant other to:  Remove weapons (e.g., guns, rifles, knives), all items previously/currently identified as safety concern.    Remove drugs/medications (over-the-counter, prescriptions, illicit drugs), all items previously/currently identified as a safety concern.  The family member/significant other verbalizes understanding of the suicide prevention education information provided.  The family member/significant other agrees to remove the items of safety concern listed above.  Gladstone Lighter 03/15/2016, 11:54 AM

## 2016-03-16 ENCOUNTER — Encounter (HOSPITAL_COMMUNITY): Payer: Self-pay | Admitting: Psychiatry

## 2016-03-16 DIAGNOSIS — Z79899 Other long term (current) drug therapy: Secondary | ICD-10-CM

## 2016-03-16 DIAGNOSIS — Z888 Allergy status to other drugs, medicaments and biological substances status: Secondary | ICD-10-CM

## 2016-03-16 DIAGNOSIS — Z91013 Allergy to seafood: Secondary | ICD-10-CM

## 2016-03-16 DIAGNOSIS — Z90710 Acquired absence of both cervix and uterus: Secondary | ICD-10-CM

## 2016-03-16 DIAGNOSIS — F322 Major depressive disorder, single episode, severe without psychotic features: Secondary | ICD-10-CM

## 2016-03-16 MED ORDER — HYDROXYZINE HCL 25 MG PO TABS: 25.0000 mg | ORAL_TABLET | Freq: Four times a day (QID) | ORAL | 0 refills | Status: DC | PRN

## 2016-03-16 MED ORDER — FLUOXETINE HCL 20 MG PO CAPS: 20.0000 mg | ORAL_CAPSULE | Freq: Every day | ORAL | 0 refills | Status: DC

## 2016-03-16 MED ORDER — TRAZODONE HCL 50 MG PO TABS: 50.0000 mg | ORAL_TABLET | Freq: Every evening | ORAL | 0 refills | Status: DC | PRN

## 2016-03-16 MED ORDER — BACITRACIN-NEOMYCIN-POLYMYXIN 400-5-5000 EX OINT: TOPICAL_OINTMENT | CUTANEOUS | 0 refills | Status: DC | PRN

## 2016-03-16 MED ORDER — QUETIAPINE FUMARATE 50 MG PO TABS: 150.0000 mg | ORAL_TABLET | Freq: Every day | ORAL | 0 refills | Status: DC

## 2016-03-16 NOTE — BHH Suicide Risk Assessment (Signed)
[facility] Discharge Suicide Risk Assessment   Principal Problem: Major depressive disorder without psychotic features Discharge Diagnoses:  Patient Active Problem List   Diagnosis Date Noted  . Major depressive disorder without psychotic features [F32.9] 03/13/2016    Total Time spent with patient: 30 minutes  Musculoskeletal: Strength & Muscle Tone: within normal limits Gait & Station: normal Patient leans: N/A  Psychiatric Specialty Exam: Review of Systems  Psychiatric/Behavioral: Negative for depression, hallucinations and suicidal ideas. The patient is not nervous/anxious.   All other systems reviewed and are negative.   Blood pressure 112/72, pulse 81, temperature 98.4 F (36.9 C), temperature source Oral, resp. rate 16, height 5' 1.81" (1.57 m), weight 57.2 kg (126 lb), last menstrual period 02/19/2007.Body mass index is 23.19 kg/m.  General Appearance: Casual  Eye Contact::  Fair  Speech:  Clear and Coherent409  Volume:  Normal  Mood:  Euthymic  Affect:  Congruent  Thought Process:  Goal Directed and Descriptions of Associations: Intact  Orientation:  Full (Time, Place, and Person)  Thought Content:  Logical  Suicidal Thoughts:  No  Homicidal Thoughts:  No  Memory:  Immediate;   Fair Recent;   Fair Remote;   Fair  Judgement:  Fair  Insight:  Fair  Psychomotor Activity:  Normal  Concentration:  Fair  Recall:  [employer] of Knowledge:Fair  Language: Fair  Akathisia:  No  Handed:  Right  AIMS (if indicated):     Assets:  Communication Skills Desire for Improvement  Sleep:  Number of Hours: 6.75  Cognition: WNL  ADL's:  Intact   Mental Status Per Nursing Assessment::   On Admission:  Suicidal ideation indicated by patient, Self-harm thoughts, Self-harm behaviors  Demographic Factors:  Caucasian  Loss Factors: NA  Historical Factors: Impulsivity  Risk Reduction Factors:   Positive social support  Continued Clinical Symptoms:  Previous Psychiatric  Diagnoses and Treatments  Cognitive Features That Contribute To Risk:  None    Suicide Risk:  Minimal: No identifiable suicidal ideation.  Patients presenting with no risk factors but with morbid ruminations; may be classified as minimal risk based on the severity of the depressive symptoms  Follow-up Information    [facility] Follow up on 04/05/2016.   Why:  at 2:40pm with Dr. Rosine Door for medication management. Contact information: [address] [place], [place] [address] Phone: [phone] Fax: [facility] Follow up.   Why:  Please use the [facility] within 1-3 days of discharge to be established for therapy services. Walk-in hours are Tuesdays (8am-12pm) and Thursdays (12pm-4pm). Contact information: [address], [address] [place] [address] [phone] (Main Office- they can give you the number to the [facility] office)          Plan Of Care/Follow-up recommendations:  Activity:  no restrictions Diet:  regular Other:  as needed  Sharlyn Odonnel, MD 03/16/2016, 9:47 AM

## 2016-03-16 NOTE — BHH Group Notes (Addendum)
Goals group  Date:  03/16/2016  Time:  -030 Type of Therapy:  Nurse Education   :  The goup is focused on teaching patients how to set daily goals that are attainanble using accronym S_M _A_R_T_. Participation Level:  Active  Participation Quality:  Appropriate  Affect:  Anxious  Cognitive:  Appropriate  Insight:  Good  Engagement in Group:  Engaged  Modes of Intervention:  Education  Summary of Progress/Problems:  Tara Schmidt 03/16/2016, 9:59 AM

## 2016-03-16 NOTE — BHH Group Notes (Signed)
Adult Therapy Group Note (Clinical Social Work)  Date:  03/16/2016  Time:  10:00-11:00AM  Group Topic/Focus: Fears and Healthy/Unhealthy Coping Skills  Building Self Esteem:   The Focus of this group was to discuss some of the prevalent fears that patients experience, and to identify the commonalities among group members.  An exercise was used to initiate the discussion, followed by writing on the white board a group-generated list of unhealthy coping and healthy coping techniques to deal with each fear.    Participation Level:  Active  Participation Quality:  Appropriate, Attentive, Sharing and Supportive  Affect:  Blunted  Cognitive:  Appropriate  Insight: Appropriate  Engagement in Group:  Engaged  Modes of Intervention:  Discussion, Exploration and Support  Additional Comments:  The patient expressed that she currently uses the healthy coping skills of journaling, and would like to learn more breathing techniques.  Selmer Dominion, LCSW 03/16/2016   12:35pm

## 2016-03-16 NOTE — Progress Notes (Signed)
D Keelia is prepared for DC today as she completes her daily assessment and is given ehr DC istructions. She completed her assessment and on it she wrote she deneid SI today and she rated her depression, hopelessness and anxiety " 0/0/0/", respectviely. After info reviewed with her, she is given cc of dc info ( AVS, SSP, transition record and , SRA and then she was escorted to bdgl entance and dc'd per MD order.
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
## 2016-09-16 ENCOUNTER — Other Ambulatory Visit: Payer: Self-pay | Admitting: Internal Medicine

## 2016-09-16 DIAGNOSIS — E230 Hypopituitarism: Secondary | ICD-10-CM

## 2016-09-27 ENCOUNTER — Ambulatory Visit
Admission: RE | Admit: 2016-09-27 | Discharge: 2016-09-27 | Disposition: A | Discharge status: Home / Self Care | Payer: Medicaid Other | Source: Ambulatory Visit | Attending: Internal Medicine | Admitting: Internal Medicine

## 2016-09-27 DIAGNOSIS — E230 Hypopituitarism: Secondary | ICD-10-CM

## 2016-09-27 MED ORDER — GADOBENATE DIMEGLUMINE 529 MG/ML IV SOLN
6.0000 mL | Freq: Once | INTRAVENOUS | Status: AC | PRN
  Administered 2016-09-27: 6 mL via INTRAVENOUS

## 2017-07-29 DIAGNOSIS — G44209 Tension-type headache, unspecified, not intractable: Secondary | ICD-10-CM | POA: Diagnosis not present

## 2017-07-29 DIAGNOSIS — Z6820 Body mass index (BMI) 20.0-20.9, adult: Secondary | ICD-10-CM | POA: Diagnosis not present

## 2017-07-30 DIAGNOSIS — Z6820 Body mass index (BMI) 20.0-20.9, adult: Secondary | ICD-10-CM | POA: Diagnosis not present

## 2017-07-30 DIAGNOSIS — T7840XA Allergy, unspecified, initial encounter: Secondary | ICD-10-CM | POA: Diagnosis not present

## 2017-10-02 DIAGNOSIS — B370 Candidal stomatitis: Secondary | ICD-10-CM | POA: Diagnosis not present

## 2017-10-02 DIAGNOSIS — Z6820 Body mass index (BMI) 20.0-20.9, adult: Secondary | ICD-10-CM | POA: Diagnosis not present

## 2017-10-21 DIAGNOSIS — F331 Major depressive disorder, recurrent, moderate: Secondary | ICD-10-CM | POA: Diagnosis not present

## 2017-10-21 DIAGNOSIS — Z6820 Body mass index (BMI) 20.0-20.9, adult: Secondary | ICD-10-CM | POA: Diagnosis not present

## 2017-10-21 DIAGNOSIS — R300 Dysuria: Secondary | ICD-10-CM | POA: Diagnosis not present

## 2017-10-30 DIAGNOSIS — F410 Panic disorder [episodic paroxysmal anxiety] without agoraphobia: Secondary | ICD-10-CM | POA: Diagnosis not present

## 2017-10-30 DIAGNOSIS — Z6820 Body mass index (BMI) 20.0-20.9, adult: Secondary | ICD-10-CM | POA: Diagnosis not present

## 2017-11-11 DIAGNOSIS — Z6820 Body mass index (BMI) 20.0-20.9, adult: Secondary | ICD-10-CM | POA: Diagnosis not present

## 2017-11-11 DIAGNOSIS — B370 Candidal stomatitis: Secondary | ICD-10-CM | POA: Diagnosis not present

## 2017-11-11 DIAGNOSIS — F410 Panic disorder [episodic paroxysmal anxiety] without agoraphobia: Secondary | ICD-10-CM | POA: Diagnosis not present

## 2017-11-17 DIAGNOSIS — F603 Borderline personality disorder: Secondary | ICD-10-CM | POA: Diagnosis not present

## 2017-11-17 DIAGNOSIS — F4310 Post-traumatic stress disorder, unspecified: Secondary | ICD-10-CM | POA: Diagnosis not present

## 2017-11-20 DIAGNOSIS — F331 Major depressive disorder, recurrent, moderate: Secondary | ICD-10-CM | POA: Diagnosis not present

## 2017-11-20 DIAGNOSIS — Z6820 Body mass index (BMI) 20.0-20.9, adult: Secondary | ICD-10-CM | POA: Diagnosis not present

## 2017-11-20 DIAGNOSIS — Z23 Encounter for immunization: Secondary | ICD-10-CM | POA: Diagnosis not present

## 2017-11-24 DIAGNOSIS — Z6820 Body mass index (BMI) 20.0-20.9, adult: Secondary | ICD-10-CM | POA: Diagnosis not present

## 2017-11-24 DIAGNOSIS — J45909 Unspecified asthma, uncomplicated: Secondary | ICD-10-CM | POA: Diagnosis not present

## 2017-11-24 DIAGNOSIS — R05 Cough: Secondary | ICD-10-CM | POA: Diagnosis not present

## 2017-12-03 DIAGNOSIS — J029 Acute pharyngitis, unspecified: Secondary | ICD-10-CM | POA: Diagnosis not present

## 2017-12-03 DIAGNOSIS — Z6820 Body mass index (BMI) 20.0-20.9, adult: Secondary | ICD-10-CM | POA: Diagnosis not present

## 2017-12-03 DIAGNOSIS — N6311 Unspecified lump in the right breast, upper outer quadrant: Secondary | ICD-10-CM | POA: Diagnosis not present

## 2017-12-08 DIAGNOSIS — B370 Candidal stomatitis: Secondary | ICD-10-CM | POA: Diagnosis not present

## 2017-12-08 DIAGNOSIS — Z6821 Body mass index (BMI) 21.0-21.9, adult: Secondary | ICD-10-CM | POA: Diagnosis not present

## 2017-12-18 DIAGNOSIS — N390 Urinary tract infection, site not specified: Secondary | ICD-10-CM | POA: Diagnosis not present

## 2017-12-18 DIAGNOSIS — Z6821 Body mass index (BMI) 21.0-21.9, adult: Secondary | ICD-10-CM | POA: Diagnosis not present

## 2017-12-18 DIAGNOSIS — F515 Nightmare disorder: Secondary | ICD-10-CM | POA: Diagnosis not present

## 2017-12-18 DIAGNOSIS — B370 Candidal stomatitis: Secondary | ICD-10-CM | POA: Diagnosis not present

## 2017-12-23 DIAGNOSIS — R05 Cough: Secondary | ICD-10-CM | POA: Diagnosis not present

## 2017-12-25 DIAGNOSIS — Z6821 Body mass index (BMI) 21.0-21.9, adult: Secondary | ICD-10-CM | POA: Diagnosis not present

## 2017-12-25 DIAGNOSIS — J45909 Unspecified asthma, uncomplicated: Secondary | ICD-10-CM | POA: Diagnosis not present

## 2017-12-25 DIAGNOSIS — I959 Hypotension, unspecified: Secondary | ICD-10-CM | POA: Diagnosis not present

## 2017-12-25 DIAGNOSIS — K921 Melena: Secondary | ICD-10-CM | POA: Diagnosis not present

## 2017-12-29 DIAGNOSIS — N6489 Other specified disorders of breast: Secondary | ICD-10-CM | POA: Diagnosis not present

## 2017-12-29 DIAGNOSIS — N6311 Unspecified lump in the right breast, upper outer quadrant: Secondary | ICD-10-CM | POA: Diagnosis not present

## 2017-12-29 DIAGNOSIS — R928 Other abnormal and inconclusive findings on diagnostic imaging of breast: Secondary | ICD-10-CM | POA: Diagnosis not present

## 2018-01-01 DIAGNOSIS — E878 Other disorders of electrolyte and fluid balance, not elsewhere classified: Secondary | ICD-10-CM | POA: Diagnosis not present

## 2018-01-01 DIAGNOSIS — R0600 Dyspnea, unspecified: Secondary | ICD-10-CM | POA: Diagnosis not present

## 2018-01-01 DIAGNOSIS — Z6821 Body mass index (BMI) 21.0-21.9, adult: Secondary | ICD-10-CM | POA: Diagnosis not present

## 2018-01-01 DIAGNOSIS — N390 Urinary tract infection, site not specified: Secondary | ICD-10-CM | POA: Diagnosis not present

## 2018-01-08 DIAGNOSIS — Z6821 Body mass index (BMI) 21.0-21.9, adult: Secondary | ICD-10-CM | POA: Diagnosis not present

## 2018-01-08 DIAGNOSIS — J45901 Unspecified asthma with (acute) exacerbation: Secondary | ICD-10-CM | POA: Diagnosis not present

## 2018-01-08 DIAGNOSIS — F331 Major depressive disorder, recurrent, moderate: Secondary | ICD-10-CM | POA: Diagnosis not present

## 2018-01-08 DIAGNOSIS — R0609 Other forms of dyspnea: Secondary | ICD-10-CM | POA: Diagnosis not present

## 2018-01-09 DIAGNOSIS — R062 Wheezing: Secondary | ICD-10-CM | POA: Diagnosis not present

## 2018-01-09 DIAGNOSIS — R0602 Shortness of breath: Secondary | ICD-10-CM | POA: Diagnosis not present

## 2018-01-09 DIAGNOSIS — R0609 Other forms of dyspnea: Secondary | ICD-10-CM | POA: Diagnosis not present

## 2018-01-09 DIAGNOSIS — R05 Cough: Secondary | ICD-10-CM | POA: Diagnosis not present

## 2018-01-12 DIAGNOSIS — Z6821 Body mass index (BMI) 21.0-21.9, adult: Secondary | ICD-10-CM | POA: Diagnosis not present

## 2018-01-12 DIAGNOSIS — R319 Hematuria, unspecified: Secondary | ICD-10-CM | POA: Diagnosis not present

## 2018-01-14 DIAGNOSIS — N200 Calculus of kidney: Secondary | ICD-10-CM | POA: Diagnosis not present

## 2018-01-14 DIAGNOSIS — R319 Hematuria, unspecified: Secondary | ICD-10-CM | POA: Diagnosis not present

## 2018-01-19 DIAGNOSIS — Z6821 Body mass index (BMI) 21.0-21.9, adult: Secondary | ICD-10-CM | POA: Diagnosis not present

## 2018-01-19 DIAGNOSIS — N200 Calculus of kidney: Secondary | ICD-10-CM | POA: Diagnosis not present

## 2018-01-20 DIAGNOSIS — F603 Borderline personality disorder: Secondary | ICD-10-CM | POA: Diagnosis not present

## 2018-01-20 DIAGNOSIS — F4310 Post-traumatic stress disorder, unspecified: Secondary | ICD-10-CM | POA: Diagnosis not present

## 2018-02-02 DIAGNOSIS — Z6821 Body mass index (BMI) 21.0-21.9, adult: Secondary | ICD-10-CM | POA: Diagnosis not present

## 2018-02-02 DIAGNOSIS — F331 Major depressive disorder, recurrent, moderate: Secondary | ICD-10-CM | POA: Diagnosis not present

## 2018-02-04 DIAGNOSIS — Z6821 Body mass index (BMI) 21.0-21.9, adult: Secondary | ICD-10-CM | POA: Diagnosis not present

## 2018-02-04 DIAGNOSIS — R220 Localized swelling, mass and lump, head: Secondary | ICD-10-CM | POA: Diagnosis not present

## 2018-02-16 DIAGNOSIS — K121 Other forms of stomatitis: Secondary | ICD-10-CM | POA: Diagnosis not present

## 2018-02-17 DIAGNOSIS — K140 Glossitis: Secondary | ICD-10-CM | POA: Diagnosis not present

## 2018-02-17 DIAGNOSIS — K1230 Oral mucositis (ulcerative), unspecified: Secondary | ICD-10-CM | POA: Diagnosis not present

## 2018-03-06 DIAGNOSIS — R5383 Other fatigue: Secondary | ICD-10-CM | POA: Diagnosis not present

## 2018-03-06 DIAGNOSIS — G2581 Restless legs syndrome: Secondary | ICD-10-CM | POA: Diagnosis not present

## 2018-03-06 DIAGNOSIS — Z6822 Body mass index (BMI) 22.0-22.9, adult: Secondary | ICD-10-CM | POA: Diagnosis not present

## 2018-03-16 DIAGNOSIS — L509 Urticaria, unspecified: Secondary | ICD-10-CM | POA: Diagnosis not present

## 2018-03-16 DIAGNOSIS — K047 Periapical abscess without sinus: Secondary | ICD-10-CM | POA: Diagnosis not present

## 2018-03-18 DIAGNOSIS — Z6822 Body mass index (BMI) 22.0-22.9, adult: Secondary | ICD-10-CM | POA: Diagnosis not present

## 2018-03-18 DIAGNOSIS — K1379 Other lesions of oral mucosa: Secondary | ICD-10-CM | POA: Diagnosis not present

## 2018-03-18 DIAGNOSIS — Z9189 Other specified personal risk factors, not elsewhere classified: Secondary | ICD-10-CM | POA: Diagnosis not present

## 2018-03-18 DIAGNOSIS — K047 Periapical abscess without sinus: Secondary | ICD-10-CM | POA: Diagnosis not present

## 2018-04-02 DIAGNOSIS — F603 Borderline personality disorder: Secondary | ICD-10-CM | POA: Diagnosis not present

## 2018-04-02 DIAGNOSIS — F349 Persistent mood [affective] disorder, unspecified: Secondary | ICD-10-CM | POA: Diagnosis not present

## 2018-04-02 DIAGNOSIS — Z79899 Other long term (current) drug therapy: Secondary | ICD-10-CM | POA: Diagnosis not present

## 2018-04-04 DIAGNOSIS — R1084 Generalized abdominal pain: Secondary | ICD-10-CM | POA: Diagnosis not present

## 2018-04-04 DIAGNOSIS — R140 Abdominal distension (gaseous): Secondary | ICD-10-CM | POA: Diagnosis not present

## 2018-04-04 DIAGNOSIS — N200 Calculus of kidney: Secondary | ICD-10-CM | POA: Diagnosis not present

## 2018-04-04 DIAGNOSIS — N83202 Unspecified ovarian cyst, left side: Secondary | ICD-10-CM | POA: Diagnosis not present

## 2018-04-06 DIAGNOSIS — K5901 Slow transit constipation: Secondary | ICD-10-CM | POA: Diagnosis not present

## 2018-04-06 DIAGNOSIS — Z6823 Body mass index (BMI) 23.0-23.9, adult: Secondary | ICD-10-CM | POA: Diagnosis not present

## 2018-04-06 DIAGNOSIS — K760 Fatty (change of) liver, not elsewhere classified: Secondary | ICD-10-CM | POA: Diagnosis not present

## 2018-04-06 DIAGNOSIS — Z9189 Other specified personal risk factors, not elsewhere classified: Secondary | ICD-10-CM | POA: Diagnosis not present

## 2018-04-14 DIAGNOSIS — J3089 Other allergic rhinitis: Secondary | ICD-10-CM | POA: Diagnosis not present

## 2018-04-14 DIAGNOSIS — R140 Abdominal distension (gaseous): Secondary | ICD-10-CM | POA: Diagnosis not present

## 2018-04-14 DIAGNOSIS — Z6823 Body mass index (BMI) 23.0-23.9, adult: Secondary | ICD-10-CM | POA: Diagnosis not present

## 2018-04-27 DIAGNOSIS — J3089 Other allergic rhinitis: Secondary | ICD-10-CM | POA: Diagnosis not present

## 2018-04-28 DIAGNOSIS — R140 Abdominal distension (gaseous): Secondary | ICD-10-CM | POA: Diagnosis not present

## 2018-04-28 DIAGNOSIS — R1310 Dysphagia, unspecified: Secondary | ICD-10-CM | POA: Diagnosis not present

## 2018-04-28 DIAGNOSIS — Z6823 Body mass index (BMI) 23.0-23.9, adult: Secondary | ICD-10-CM | POA: Diagnosis not present

## 2018-04-29 DIAGNOSIS — R109 Unspecified abdominal pain: Secondary | ICD-10-CM | POA: Diagnosis not present

## 2018-05-04 DIAGNOSIS — J3089 Other allergic rhinitis: Secondary | ICD-10-CM | POA: Diagnosis not present

## 2018-05-08 DIAGNOSIS — J3089 Other allergic rhinitis: Secondary | ICD-10-CM | POA: Diagnosis not present

## 2018-05-12 DIAGNOSIS — J3089 Other allergic rhinitis: Secondary | ICD-10-CM | POA: Diagnosis not present

## 2018-05-15 DIAGNOSIS — J3089 Other allergic rhinitis: Secondary | ICD-10-CM | POA: Diagnosis not present

## 2018-05-18 DIAGNOSIS — J3089 Other allergic rhinitis: Secondary | ICD-10-CM | POA: Diagnosis not present

## 2018-05-25 DIAGNOSIS — J3089 Other allergic rhinitis: Secondary | ICD-10-CM | POA: Diagnosis not present

## 2018-05-28 DIAGNOSIS — J3089 Other allergic rhinitis: Secondary | ICD-10-CM | POA: Diagnosis not present

## 2018-06-08 DIAGNOSIS — J3089 Other allergic rhinitis: Secondary | ICD-10-CM | POA: Diagnosis not present

## 2018-06-11 DIAGNOSIS — J3089 Other allergic rhinitis: Secondary | ICD-10-CM | POA: Diagnosis not present

## 2018-06-30 ENCOUNTER — Other Ambulatory Visit: Payer: Self-pay

## 2018-06-30 ENCOUNTER — Telehealth (INDEPENDENT_AMBULATORY_CARE_PROVIDER_SITE_OTHER): Payer: Medicare Other | Admitting: Gastroenterology

## 2018-06-30 ENCOUNTER — Encounter: Payer: Self-pay | Admitting: Gastroenterology

## 2018-06-30 VITALS — Ht 61.0 in | Wt 125.0 lb

## 2018-06-30 DIAGNOSIS — R1030 Lower abdominal pain, unspecified: Secondary | ICD-10-CM

## 2018-06-30 DIAGNOSIS — K219 Gastro-esophageal reflux disease without esophagitis: Secondary | ICD-10-CM

## 2018-06-30 DIAGNOSIS — K581 Irritable bowel syndrome with constipation: Secondary | ICD-10-CM

## 2018-06-30 DIAGNOSIS — G5602 Carpal tunnel syndrome, left upper limb: Secondary | ICD-10-CM | POA: Diagnosis not present

## 2018-06-30 DIAGNOSIS — R140 Abdominal distension (gaseous): Secondary | ICD-10-CM

## 2018-06-30 DIAGNOSIS — R1310 Dysphagia, unspecified: Secondary | ICD-10-CM

## 2018-06-30 MED ORDER — PANTOPRAZOLE SODIUM 40 MG PO TBEC: 40.0000 mg | DELAYED_RELEASE_TABLET | Freq: Every day | ORAL | 4 refills | Status: DC

## 2018-06-30 MED ORDER — SUPREP BOWEL PREP KIT 17.5-3.13-1.6 GM/177ML PO SOLN: 1.0000 | ORAL | 0 refills | Status: DC

## 2018-06-30 NOTE — Patient Instructions (Signed)
If you are age 40 or older, your body mass index should be between 23-30. Your Body mass index is 23.62 kg/m. If this is out of the aforementioned range listed, please consider follow up with your Primary Care Provider.  If you are age 41 or younger, your body mass index should be between 19-25. Your Body mass index is 23.62 kg/m. If this is out of the aformentioned range listed, please consider follow up with your Primary Care Provider.   You have been scheduled for an endoscopy and colonoscopy. Please follow the written instructions given to you at your visit today. Please pick up your prep supplies at the pharmacy within the next 1-3 days. If you use inhalers (even only as needed), please bring them with you on the day of your procedure. Your physician has requested that you go to www.startemmi.com and enter the access code given to you at your visit today. This web site gives a general overview about your procedure. However, you should still follow specific instructions given to you by our office regarding your preparation for the procedure.  To help prevent the possible spread of infection to our patients, communities, and staff; we will be implementing the following measures:  As of now we are not allowing any visitors/family members to accompany you to any upcoming appointments with [facility]. If you have any concerns about this please contact our office to discuss prior to the appointment.    We have sent the following medications to your pharmacy for you to pick up at your convenience: Suprep Protonix  Two days before your procedure: Mix 3 packs (or capfuls) of Miralax in 48 ounces of clear liquid and drink at 6pm.  Thank you,  Dr. Jackquline Denmark

## 2018-06-30 NOTE — Progress Notes (Signed)
Chief Complaint:   Referring Provider:  Marco Collie, MD      ASSESSMENT AND PLAN;   #1. Lower abdominal pain. Neg CT at [facility].  Report awaited. #2. IBS with predominant constipation.  H/O rectal bleeding is concerning. #3. Abdominal bloating. #4. GERD with esophageal dysphagia. ?  Etiology. D/d includes esophageal stricture, Schatzki's ring, motility disorder, eosinophilic esophagitis, pill induced esophagitis, doubt esophageal carcinoma or extrinsic lesions.  Plan: - Protonix 40mg  po qd #30, 4 refills - Miralax 17g po qd.  - Proceed with EGD with possible dil/colon with 2 day prep (miralax and suprep). Discussed risks & benefits. (Risks including rare perforation req laparotomy, bleeding after biopsies/polypectomy req blood transfusion, rare chance of missing neoplasms, risks of anesthesia/sedation). Benefits outweigh the risks. Patient agrees to proceed. All the questions were answered. - Please obtain previous records (labs, CT from [facility]).   Addendum: Records from [facility] ED 04/29/2018: Hb 12.7, MCV 93, platelets 371, WBC count 9.9. UA- neg, normal CMP except for glucose 154, normal lipase at 75.  CT 04/04/2018 (with IV contrast): Mild fatty liver, tiny nonobstructing right renal calculus, small left ovarian cyst measuring 2.8 cm.  Otherwise negative CT.  HPI:    Tara Schmidt is a 38 y.o. female  Lower abdominal pain x 1 month With associated constipation, better with miralax, H/O pellet like stools, mucus in the stool x 2-3 months Rectal bleeding x 1 month, with wiping.  Occasionally mixed with the stool.  Advised by Dr. Nyra Capes to get colonoscopy performed. With significant bloating " looks like she is 8 months pregnant" Seen in the ED at [facility] were normal, had negative CT scan abdomen and pelvis.  Records awaited.  Has nausea, has been on promethazine. No vomiting Has heartburn Lately having dysphagia - both with solids and liquids, omeprazole didn't  help, hence stopped.  Sodas 2 cans/day, cherry vanilla coke. No chewing gum  No problems with milk/cheese/gluten.   No wt loss.  Past Medical History:  Diagnosis Date  . Abnormal tympanic membrane of both ears   . Acid reflux   . Anxiety   . Asthma   . Depression   . History of TMJ disorder   . Hypertension   . Hypoglycemia   . Sacroiliitis ([place])   . Suicide attempt ([place])    x 3  . Syncope and collapse     Past Surgical History:  Procedure Laterality Date  . ABDOMINAL HYSTERECTOMY     partical hysterectomy and right ovary. Currently have left ovary  . WISDOM TOOTH EXTRACTION     all 3    Family History  Problem Relation Age of Onset  . Heart disease Father   . Hypertension Father   . Multiple sclerosis Father   . Prostate cancer Maternal Grandfather   . Cancer Paternal Grandfather        possibly esophageal  . Colon cancer Neg Hx     Social History   Tobacco Use  . Smoking status: Never Smoker  . Smokeless tobacco: Never Used  Substance Use Topics  . Alcohol use: No  . Drug use: No    Current Outpatient Medications  Medication Sig Dispense Refill  . AMBULATORY NON FORMULARY MEDICATION Allergy shot with Dr Nyra Capes 2 times a week    . benztropine (COGENTIN) 0.5 MG tablet Take 1 tablet by mouth 2 (two) times daily.    Marland Kitchen FLUoxetine (PROZAC) 20 MG capsule Take 1 capsule (20 mg total) by mouth daily. [place]  capsule 0  . hydrOXYzine (ATARAX/VISTARIL) 25 MG tablet Take 1 tablet (25 mg total) by mouth every 6 (six) hours as needed for anxiety (Sleep). 30 tablet 0  . ipratropium-albuterol (DUONEB) 0.5-2.5 (3) MG/3ML SOLN Take 3 mLs by nebulization every 4 (four) hours as needed.    Marland Kitchen LAMICTAL ODT 100 MG TBDP Take 1 tablet by mouth 2 (two) times daily.    . Loratadine (CLARITIN) 10 MG CAPS Take 1 capsule by mouth daily.    . Polyethylene Glycol 3350 (MIRALAX PO) Take by mouth daily.    Marland Kitchen rOPINIRole (REQUIP) 0.5 MG tablet Take 1 tablet by mouth daily as needed.    .  salmeterol (SEREVENT DISKUS) 50 MCG/DOSE diskus inhaler 1 puff daily.    . Simethicone (GAS-X PO) Take 1 tablet by mouth daily.    Marland Kitchen VALACYCLOVIR HCL PO Take 2 tablets by mouth daily.     No current facility-administered medications for this visit.     Allergies  Allergen Reactions  . Other Hives and Other (See Comments)    Seafood - throat closes up Seasonal allergies, dogs, cats Corn, tomoato  . Penicillins Swelling    Throat, tongue, rash   . Effexor [Venlafaxine] Hives  . Latex Hives  . Septra [Sulfamethoxazole-Trimethoprim] Other (See Comments)    GI upset  . Zoloft [Sertraline Hcl] Hives  . Adhesive [Tape] Hives and Rash  . Azithromycin Rash    Review of Systems:  Constitutional: Denies fever, chills, diaphoresis, appetite change and fatigue.  HEENT: Denies photophobia, eye pain, redness, hearing loss, ear pain, congestion, sore throat, rhinorrhea, sneezing, mouth sores, neck pain, neck stiffness and tinnitus.   Respiratory: Denies SOB, DOE, cough, chest tightness,  and wheezing.   Cardiovascular: Denies chest pain, palpitations and leg swelling.  Genitourinary: Denies dysuria, urgency, frequency, hematuria, flank pain and difficulty urinating.  Musculoskeletal: Denies myalgias, back pain, joint swelling, arthralgias and gait problem.  Skin: No rash.  Neurological: Denies dizziness, seizures, syncope, weakness, light-headedness, numbness and headaches.  Hematological: Denies adenopathy. Easy bruising, personal or family bleeding history  Psychiatric/Behavioral: has anxiety/depression     Physical Exam:    Ht 5\' 1"  (1.549 m)   Wt 125 lb (56.7 kg)   LMP 02/19/2007 (Approximate)   BMI 23.62 kg/m  Filed Weights   06/30/18 1055  Weight: 125 lb (56.7 kg)    Data Reviewed: I have personally reviewed following labs and imaging studies  CBC: CBC Latest Ref Rng & Units 06/11/2015  WBC 4.0 - 10.5 K/uL 11.0(H)  Hemoglobin 12.0 - 15.0 g/dL 12.9  Hematocrit 36.0 - 46.0  % 37.5  Platelets 150 - 400 K/uL 325    CMP: CMP Latest Ref Rng & Units 06/11/2015  Glucose 65 - 99 mg/dL 91  BUN 6 - 20 mg/dL 11  Creatinine 0.44 - 1.00 mg/dL 0.91  Sodium 135 - 145 mmol/L 140  Potassium 3.5 - 5.1 mmol/L 3.9  Chloride 101 - 111 mmol/L 109  CO2 22 - 32 mmol/L 27  Calcium 8.9 - 10.3 mg/dL 9.2  Total Protein 6.5 - 8.1 g/dL 7.7  Total Bilirubin 0.3 - 1.2 mg/dL 0.4  Alkaline Phos 38 - 126 U/L 67  AST 15 - 41 U/L 21  ALT 14 - 54 U/L 18   This service was provided via telemedicine.  The patient was located at home.  The provider was located in office.  The patient did consent to this telephone visit and is aware of possible charges through their insurance for  this visit.  The patient was referred by Dr. Nyra Capes.   Time spent on call and coordination of care: 45 min     Carmell Austria, MD 06/30/2018, 1:36 PM  Cc: Marco Collie, MD

## 2018-07-03 ENCOUNTER — Telehealth: Payer: Self-pay | Admitting: *Deleted

## 2018-07-03 NOTE — Telephone Encounter (Signed)
Covid-19 travel screening questions  Have you traveled in the last 14 days? no If yes where?  Do you now or have you had a fever in the last 14 days? no  Do you have any respiratory symptoms of shortness of breath or cough now or in the last 14 days? no  Do you have any family members or close contacts with diagnosed or suspected Covid-19? No  Pt is aware that care partner will be waiting in the car during procedure.  She will wear a mask into building       

## 2018-07-06 ENCOUNTER — Ambulatory Visit (AMBULATORY_SURGERY_CENTER): Payer: Medicare Other | Admitting: Gastroenterology

## 2018-07-06 ENCOUNTER — Encounter: Payer: Self-pay | Admitting: Gastroenterology

## 2018-07-06 ENCOUNTER — Other Ambulatory Visit: Payer: Self-pay

## 2018-07-06 VITALS — BP 110/77 | HR 77 | Temp 98.8°F | Resp 18 | Ht 61.0 in | Wt 125.0 lb

## 2018-07-06 DIAGNOSIS — R109 Unspecified abdominal pain: Secondary | ICD-10-CM | POA: Diagnosis not present

## 2018-07-06 DIAGNOSIS — R1030 Lower abdominal pain, unspecified: Secondary | ICD-10-CM

## 2018-07-06 DIAGNOSIS — K219 Gastro-esophageal reflux disease without esophagitis: Secondary | ICD-10-CM | POA: Diagnosis not present

## 2018-07-06 DIAGNOSIS — K589 Irritable bowel syndrome without diarrhea: Secondary | ICD-10-CM | POA: Diagnosis not present

## 2018-07-06 DIAGNOSIS — K648 Other hemorrhoids: Secondary | ICD-10-CM

## 2018-07-06 DIAGNOSIS — K2970 Gastritis, unspecified, without bleeding: Secondary | ICD-10-CM | POA: Diagnosis not present

## 2018-07-06 DIAGNOSIS — R1310 Dysphagia, unspecified: Secondary | ICD-10-CM

## 2018-07-06 DIAGNOSIS — K5730 Diverticulosis of large intestine without perforation or abscess without bleeding: Secondary | ICD-10-CM

## 2018-07-06 MED ORDER — SODIUM CHLORIDE 0.9 % IV SOLN: 500.0000 mL | Freq: Once | INTRAVENOUS | Status: DC

## 2018-07-06 NOTE — Op Note (Addendum)
[place] Patient Name: Tara Schmidt Procedure Date: 07/06/2018 3:14 PM MRN: [id number] Endoscopist: Jackquline Denmark , MD Age: 38 Referring MD:  Date of Birth: 02-03-1981 Gender: Female Account #: [id number] Procedure:                Upper GI endoscopy Indications:              Dysphagia Medicines:                Monitored Anesthesia Care Procedure:                Pre-Anesthesia Assessment:                           - Prior to the procedure, a History and Physical                            was performed, and patient medications and                            allergies were reviewed. The patient's tolerance of                            previous anesthesia was also reviewed. The risks                            and benefits of the procedure and the sedation                            options and risks were discussed with the patient.                            All questions were answered, and informed consent                            was obtained. Prior Anticoagulants: The patient has                            taken no previous anticoagulant or antiplatelet                            agents. ASA Grade Assessment: I - A normal, healthy                            patient. After reviewing the risks and benefits,                            the patient was deemed in satisfactory condition to                            undergo the procedure.                           After obtaining informed consent, the endoscope was  passed under direct vision. Throughout the                            procedure, the patient's blood pressure, pulse, and                            oxygen saturations were monitored continuously. The                            Model GIF-HQ190 [id number]) scope was introduced                            through the mouth, and advanced to the second part                            of duodenum. The upper GI endoscopy was           accomplished without difficulty. The patient                            tolerated the procedure well. Scope In: Scope Out: Findings:                 The examined esophagus was mildly tortuous.                            Biopsies were obtained from the proximal and distal                            esophagus with cold forceps for histology of                            suspected eosinophilic esophagitis. The scope was                            withdrawn. Dilation was performed with a Maloney                            dilator with no resistance at 48 Fr. Estimated                            blood loss: none.                           Z line was well-defined at 36 cm. Examined by                            narrowband imaging.                           Localized mild inflammation characterized by                            erythema was found in the gastric antrum. Biopsies  were taken with a cold forceps for histology.                            Estimated blood loss: none.                           The examined duodenum was normal. Biopsies were                            taken with a cold forceps for histology. Estimated                            blood loss: none. Complications:            No immediate complications. Estimated Blood Loss:     Estimated blood loss: none. Impression:               -Mildly tortuous esophagus s/p empiric esophageal                            dilatation.                           -Mild gastritis. Recommendation:           - Patient has a contact number available for                            emergencies. The signs and symptoms of potential                            delayed complications were discussed with the                            patient. Return to normal activities tomorrow.                            Written discharge instructions were provided to the                            patient.                            - Post dilatation diet.                           - Continue Protonix 40 mg p.o. once a day.                           - Await pathology results.                           - Chew foods specially meats and breads well and                            eat slowly.                           -  Return to GI clinic in 12 weeks. If still with                            problems, will consider esophageal manometry or                            further work-up. Jackquline Denmark, MD 07/06/2018 4:01:34 PM This report has been signed electronically.

## 2018-07-06 NOTE — Progress Notes (Signed)
Report to PACU, RN, vss, BBS= Clear.  

## 2018-07-06 NOTE — Patient Instructions (Signed)
YOU HAD AN ENDOSCOPIC PROCEDURE TODAY AT [facility]:   Refer to the procedure report that was given to you for any specific questions about what was found during the examination.  If the procedure report does not answer your questions, please call your gastroenterologist to clarify.  If you requested that your care partner not be given the details of your procedure findings, then the procedure report has been included in a sealed envelope for you to review at your convenience later.  YOU SHOULD EXPECT: Some feelings of bloating in the abdomen. Passage of more gas than usual.  Walking can help get rid of the air that was put into your GI tract during the procedure and reduce the bloating. If you had a lower endoscopy (such as a colonoscopy or flexible sigmoidoscopy) you may notice spotting of blood in your stool or on the toilet paper. If you underwent a bowel prep for your procedure, you may not have a normal bowel movement for a few days.  Please Note:  You might notice some irritation and congestion in your nose or some drainage.  This is from the oxygen used during your procedure.  There is no need for concern and it should clear up in a day or so.  SYMPTOMS TO REPORT IMMEDIATELY:   Following lower endoscopy (colonoscopy or flexible sigmoidoscopy):  Excessive amounts of blood in the stool  Significant tenderness or worsening of abdominal pains  Swelling of the abdomen that is new, acute  Fever of 100F or higher   Following upper endoscopy (EGD)  Vomiting of blood or coffee ground material  New chest pain or pain under the shoulder blades  Painful or persistently difficult swallowing  New shortness of breath  Fever of 100F or higher  Black, tarry-looking stools  For urgent or emergent issues, a gastroenterologist can be reached at any hour by calling [phone].   DIET:  Please follow the dilatation diet the rest of today.  Handout was given to you along with your  discharge instructions.  Drink plenty of fluids but you should avoid alcoholic beverages for 24 hours.  ACTIVITY:  You should plan to take it easy for the rest of today and you should NOT DRIVE or use heavy machinery until tomorrow (because of the sedation medicines used during the test).    FOLLOW UP: Our staff will call the number listed on your records 48-72 hours following your procedure to check on you and address any questions or concerns that you may have regarding the information given to you following your procedure. If we do not reach you, we will leave a message.  We will attempt to reach you two times.  During this call, we will ask if you have developed any symptoms of COVID 19. If you develop any symptoms (for example fever, flu-like symptoms, shortness of breath, cough etc.) before then, please call [phone].  If any biopsies were taken you will be contacted by phone or by letter within the next 1-3 weeks.  Please call us at [phone] if you have not heard about the biopsies in 3 weeks.    SIGNATURES/CONFIDENTIALITY: You and/or your care partner have signed paperwork which will be entered into your electronic medical record.  These signatures attest to the fact that that the information above on your After Visit Summary has been reviewed and is understood.  Full responsibility of the confidentiality of this discharge information lies with you and/or your care-partner.  Handouts were given to your care partner on diverticulosis, hemorrhoids, a high fiber diet, gastritis and the esophageal dilatation diet to follow the rest of today.   Continue Protonix 40 mg by mouth once a day. Continue Miralax 17 gm on 1 cap full a day. You may resume your current medications today. Chew foods especially meats and breads well and eat slowly. Await biopsy results. Repeat colonoscopy in 10 years for screening purposes. The office will call you with an appointment to see Dr. Lyndel Safe in 12  weeks. Please call if any questions or concerns.

## 2018-07-06 NOTE — Progress Notes (Signed)
Called to room to assist during endoscopic procedure.  Patient ID and intended procedure confirmed with present staff. Received instructions for my participation in the procedure from the performing physician.  

## 2018-07-06 NOTE — Progress Notes (Signed)
No problems noted in the recovery room. maw 

## 2018-07-06 NOTE — Op Note (Signed)
[place] Patient Name: Tara Schmidt Procedure Date: 07/06/2018 3:14 PM MRN: [id number] Endoscopist: Jackquline Denmark , MD Age: 38 Referring MD:  Date of Birth: 1980-12-26 Gender: Female Account #: [id number] Procedure:                Colonoscopy Indications:              #1. Lower abdominal pain. Neg CT at [facility]. Report                            awaited.                           #2. IBS with predominant constipation. H/O rectal                            bleeding is concerning.                           #3. Abdominal bloating. Medicines:                Monitored Anesthesia Care Procedure:                Pre-Anesthesia Assessment:                           - Prior to the procedure, a History and Physical                            was performed, and patient medications and                            allergies were reviewed. The patient's tolerance of                            previous anesthesia was also reviewed. The risks                            and benefits of the procedure and the sedation                            options and risks were discussed with the patient.                            All questions were answered, and informed consent                            was obtained. Prior Anticoagulants: The patient has                            taken no previous anticoagulant or antiplatelet                            agents. ASA Grade Assessment: I - A normal, healthy  patient. After reviewing the risks and benefits,                            the patient was deemed in satisfactory condition to                            undergo the procedure.                           After obtaining informed consent, the colonoscope                            was passed under direct vision. Throughout the                            procedure, the patient's blood pressure, pulse, and                            oxygen saturations were monitored continuously.  The                            Model [id number]-[id number] scope was introduced                            through the anus and advanced to the. The                            Colonoscope was introduced through the and advanced                            to the 2 cm into the ileum. The colonoscopy was                            performed without difficulty. The patient tolerated                            the procedure well. The quality of the bowel                            preparation was excellent. The terminal ileum,                            ileocecal valve, appendiceal orifice, and rectum                            were photographed. Scope In: 3:36:49 PM Scope Out: 3:49:39 PM Scope Withdrawal Time: 0 hours 8 minutes 42 seconds  Total Procedure Duration: 0 hours 12 minutes 50 seconds  Findings:                 A few small-mouthed diverticula were found in the                            sigmoid colon, descending colon and ascending colon.  Non-bleeding internal hemorrhoids were found during                            retroflexion. The hemorrhoids were small.                           The exam was otherwise without abnormality on                            direct and retroflexion views.                           The terminal ileum appeared normal. Complications:            No immediate complications. Estimated Blood Loss:     Estimated blood loss: none. Impression:               -Mild pancolonic diverticulosis                           -Small internal hemorrhoids.                           -Otherwise normal colonoscopy to TI. Recommendation:           - Patient has a contact number available for                            emergencies. The signs and symptoms of potential                            delayed complications were discussed with the                            patient. Return to normal activities tomorrow.                            Written  discharge instructions were provided to the                            patient.                           - High fiber diet.                           - Continue present medications. Continue MiraLAX 17                            g p.o. once a day                           - Repeat colonoscopy in 10 years for screening                            purposes. Earlier, if with any new problems or if  there is any change in family history.                           - Return to GI office in 12 weeks. Jackquline Denmark, MD 07/06/2018 3:57:13 PM This report has been signed electronically.

## 2018-07-08 ENCOUNTER — Telehealth: Payer: Self-pay

## 2018-07-08 NOTE — Telephone Encounter (Signed)
Follow up call made, no answer and voicemail not available.

## 2018-07-08 NOTE — Telephone Encounter (Signed)
2nd follow up call attempt made.  Pt did not answer and mailbox is full, unable to leave message.

## 2018-07-16 ENCOUNTER — Encounter: Payer: Self-pay | Admitting: Gastroenterology

## 2018-07-16 DIAGNOSIS — G5602 Carpal tunnel syndrome, left upper limb: Secondary | ICD-10-CM | POA: Diagnosis not present

## 2018-07-29 DIAGNOSIS — M7701 Medial epicondylitis, right elbow: Secondary | ICD-10-CM | POA: Diagnosis not present

## 2018-07-29 DIAGNOSIS — M25521 Pain in right elbow: Secondary | ICD-10-CM | POA: Diagnosis not present

## 2018-08-30 DIAGNOSIS — I959 Hypotension, unspecified: Secondary | ICD-10-CM | POA: Diagnosis not present

## 2018-08-30 DIAGNOSIS — R531 Weakness: Secondary | ICD-10-CM | POA: Diagnosis not present

## 2018-09-24 DIAGNOSIS — M7711 Lateral epicondylitis, right elbow: Secondary | ICD-10-CM | POA: Diagnosis not present

## 2018-10-08 DIAGNOSIS — M79641 Pain in right hand: Secondary | ICD-10-CM | POA: Diagnosis not present

## 2018-10-13 DIAGNOSIS — I959 Hypotension, unspecified: Secondary | ICD-10-CM | POA: Diagnosis not present

## 2018-10-13 DIAGNOSIS — Z6822 Body mass index (BMI) 22.0-22.9, adult: Secondary | ICD-10-CM | POA: Diagnosis not present

## 2018-10-13 DIAGNOSIS — R5383 Other fatigue: Secondary | ICD-10-CM | POA: Diagnosis not present

## 2018-10-14 DIAGNOSIS — I959 Hypotension, unspecified: Secondary | ICD-10-CM | POA: Diagnosis not present

## 2018-10-14 DIAGNOSIS — R5383 Other fatigue: Secondary | ICD-10-CM | POA: Diagnosis not present

## 2018-10-22 DIAGNOSIS — I959 Hypotension, unspecified: Secondary | ICD-10-CM | POA: Diagnosis not present

## 2018-10-22 DIAGNOSIS — Z6823 Body mass index (BMI) 23.0-23.9, adult: Secondary | ICD-10-CM | POA: Diagnosis not present

## 2018-10-29 DIAGNOSIS — Z6822 Body mass index (BMI) 22.0-22.9, adult: Secondary | ICD-10-CM | POA: Diagnosis not present

## 2018-10-29 DIAGNOSIS — R1011 Right upper quadrant pain: Secondary | ICD-10-CM | POA: Diagnosis not present

## 2018-10-29 DIAGNOSIS — I959 Hypotension, unspecified: Secondary | ICD-10-CM | POA: Diagnosis not present

## 2018-10-30 DIAGNOSIS — Z23 Encounter for immunization: Secondary | ICD-10-CM | POA: Diagnosis not present

## 2018-10-30 DIAGNOSIS — R1031 Right lower quadrant pain: Secondary | ICD-10-CM | POA: Diagnosis not present

## 2018-12-18 DIAGNOSIS — R252 Cramp and spasm: Secondary | ICD-10-CM | POA: Diagnosis not present

## 2018-12-18 DIAGNOSIS — F419 Anxiety disorder, unspecified: Secondary | ICD-10-CM | POA: Diagnosis not present

## 2018-12-18 DIAGNOSIS — J45909 Unspecified asthma, uncomplicated: Secondary | ICD-10-CM | POA: Insufficient documentation

## 2018-12-18 DIAGNOSIS — F603 Borderline personality disorder: Secondary | ICD-10-CM | POA: Insufficient documentation

## 2018-12-18 DIAGNOSIS — Z760 Encounter for issue of repeat prescription: Secondary | ICD-10-CM | POA: Diagnosis not present

## 2018-12-18 DIAGNOSIS — M545 Low back pain: Secondary | ICD-10-CM | POA: Diagnosis not present

## 2018-12-18 DIAGNOSIS — Z6824 Body mass index (BMI) 24.0-24.9, adult: Secondary | ICD-10-CM | POA: Diagnosis not present

## 2018-12-18 DIAGNOSIS — I7300 Raynaud's syndrome without gangrene: Secondary | ICD-10-CM | POA: Insufficient documentation

## 2018-12-18 DIAGNOSIS — M25519 Pain in unspecified shoulder: Secondary | ICD-10-CM | POA: Diagnosis not present

## 2020-03-15 DIAGNOSIS — L509 Urticaria, unspecified: Secondary | ICD-10-CM | POA: Diagnosis not present

## 2020-03-15 DIAGNOSIS — Z6823 Body mass index (BMI) 23.0-23.9, adult: Secondary | ICD-10-CM | POA: Diagnosis not present

## 2020-03-29 DIAGNOSIS — J45909 Unspecified asthma, uncomplicated: Secondary | ICD-10-CM | POA: Diagnosis not present

## 2020-03-29 DIAGNOSIS — L299 Pruritus, unspecified: Secondary | ICD-10-CM | POA: Diagnosis not present

## 2020-03-29 DIAGNOSIS — E785 Hyperlipidemia, unspecified: Secondary | ICD-10-CM | POA: Diagnosis not present

## 2020-03-29 DIAGNOSIS — J3089 Other allergic rhinitis: Secondary | ICD-10-CM | POA: Diagnosis not present

## 2020-03-30 DIAGNOSIS — Z6824 Body mass index (BMI) 24.0-24.9, adult: Secondary | ICD-10-CM | POA: Diagnosis not present

## 2020-03-30 DIAGNOSIS — L299 Pruritus, unspecified: Secondary | ICD-10-CM | POA: Diagnosis not present

## 2020-03-30 DIAGNOSIS — J45909 Unspecified asthma, uncomplicated: Secondary | ICD-10-CM | POA: Diagnosis not present

## 2020-03-30 DIAGNOSIS — J3089 Other allergic rhinitis: Secondary | ICD-10-CM | POA: Diagnosis not present

## 2020-03-30 DIAGNOSIS — F331 Major depressive disorder, recurrent, moderate: Secondary | ICD-10-CM | POA: Diagnosis not present

## 2020-04-07 DIAGNOSIS — E785 Hyperlipidemia, unspecified: Secondary | ICD-10-CM | POA: Diagnosis not present

## 2020-04-07 DIAGNOSIS — I10 Essential (primary) hypertension: Secondary | ICD-10-CM | POA: Diagnosis not present

## 2020-04-07 DIAGNOSIS — L299 Pruritus, unspecified: Secondary | ICD-10-CM | POA: Diagnosis not present

## 2020-04-07 DIAGNOSIS — Z6824 Body mass index (BMI) 24.0-24.9, adult: Secondary | ICD-10-CM | POA: Diagnosis not present

## 2020-04-20 DIAGNOSIS — Z1152 Encounter for screening for COVID-19: Secondary | ICD-10-CM | POA: Diagnosis not present

## 2020-04-20 DIAGNOSIS — Z20822 Contact with and (suspected) exposure to covid-19: Secondary | ICD-10-CM | POA: Diagnosis not present

## 2020-04-20 DIAGNOSIS — J029 Acute pharyngitis, unspecified: Secondary | ICD-10-CM | POA: Diagnosis not present

## 2020-05-03 DIAGNOSIS — R1084 Generalized abdominal pain: Secondary | ICD-10-CM | POA: Diagnosis not present

## 2020-05-03 DIAGNOSIS — R3129 Other microscopic hematuria: Secondary | ICD-10-CM | POA: Diagnosis not present

## 2020-05-06 DIAGNOSIS — K5750 Diverticulosis of both small and large intestine without perforation or abscess without bleeding: Secondary | ICD-10-CM | POA: Diagnosis not present

## 2020-05-06 DIAGNOSIS — N2889 Other specified disorders of kidney and ureter: Secondary | ICD-10-CM | POA: Diagnosis not present

## 2020-05-06 DIAGNOSIS — K760 Fatty (change of) liver, not elsewhere classified: Secondary | ICD-10-CM | POA: Diagnosis not present

## 2020-05-06 DIAGNOSIS — N200 Calculus of kidney: Secondary | ICD-10-CM | POA: Diagnosis not present

## 2020-05-06 DIAGNOSIS — N23 Unspecified renal colic: Secondary | ICD-10-CM | POA: Diagnosis not present

## 2020-05-30 DIAGNOSIS — M5430 Sciatica, unspecified side: Secondary | ICD-10-CM | POA: Diagnosis not present

## 2020-05-30 DIAGNOSIS — Z6825 Body mass index (BMI) 25.0-25.9, adult: Secondary | ICD-10-CM | POA: Diagnosis not present

## 2020-06-12 DIAGNOSIS — R109 Unspecified abdominal pain: Secondary | ICD-10-CM | POA: Diagnosis not present

## 2020-06-12 DIAGNOSIS — Z87442 Personal history of urinary calculi: Secondary | ICD-10-CM | POA: Diagnosis not present

## 2020-06-12 DIAGNOSIS — N201 Calculus of ureter: Secondary | ICD-10-CM | POA: Diagnosis not present

## 2020-06-14 DIAGNOSIS — B079 Viral wart, unspecified: Secondary | ICD-10-CM | POA: Diagnosis not present

## 2020-06-27 DIAGNOSIS — R1031 Right lower quadrant pain: Secondary | ICD-10-CM | POA: Diagnosis not present

## 2020-06-27 DIAGNOSIS — N200 Calculus of kidney: Secondary | ICD-10-CM | POA: Diagnosis not present

## 2020-06-28 DIAGNOSIS — N200 Calculus of kidney: Secondary | ICD-10-CM | POA: Diagnosis not present

## 2020-07-15 DIAGNOSIS — R6889 Other general symptoms and signs: Secondary | ICD-10-CM | POA: Diagnosis not present

## 2020-07-15 DIAGNOSIS — U071 COVID-19: Secondary | ICD-10-CM | POA: Diagnosis not present

## 2020-07-15 DIAGNOSIS — R058 Other specified cough: Secondary | ICD-10-CM | POA: Diagnosis not present

## 2020-07-15 DIAGNOSIS — Z20822 Contact with and (suspected) exposure to covid-19: Secondary | ICD-10-CM | POA: Diagnosis not present

## 2020-07-21 DIAGNOSIS — Z6824 Body mass index (BMI) 24.0-24.9, adult: Secondary | ICD-10-CM | POA: Diagnosis not present

## 2020-07-21 DIAGNOSIS — B354 Tinea corporis: Secondary | ICD-10-CM | POA: Diagnosis not present

## 2020-08-30 DIAGNOSIS — M5432 Sciatica, left side: Secondary | ICD-10-CM | POA: Diagnosis not present

## 2020-08-30 DIAGNOSIS — I10 Essential (primary) hypertension: Secondary | ICD-10-CM | POA: Diagnosis not present

## 2020-08-30 DIAGNOSIS — Z6824 Body mass index (BMI) 24.0-24.9, adult: Secondary | ICD-10-CM | POA: Diagnosis not present

## 2020-09-12 DIAGNOSIS — R002 Palpitations: Secondary | ICD-10-CM | POA: Diagnosis not present

## 2020-09-12 DIAGNOSIS — Z6824 Body mass index (BMI) 24.0-24.9, adult: Secondary | ICD-10-CM | POA: Diagnosis not present

## 2020-09-12 DIAGNOSIS — M5432 Sciatica, left side: Secondary | ICD-10-CM | POA: Diagnosis not present

## 2020-09-12 DIAGNOSIS — F331 Major depressive disorder, recurrent, moderate: Secondary | ICD-10-CM | POA: Diagnosis not present

## 2020-09-12 DIAGNOSIS — Z1231 Encounter for screening mammogram for malignant neoplasm of breast: Secondary | ICD-10-CM | POA: Diagnosis not present

## 2020-09-12 DIAGNOSIS — Z0001 Encounter for general adult medical examination with abnormal findings: Secondary | ICD-10-CM | POA: Diagnosis not present

## 2020-09-18 DIAGNOSIS — R002 Palpitations: Secondary | ICD-10-CM | POA: Diagnosis not present

## 2020-09-19 DIAGNOSIS — Z1329 Encounter for screening for other suspected endocrine disorder: Secondary | ICD-10-CM | POA: Diagnosis not present

## 2020-09-19 DIAGNOSIS — E785 Hyperlipidemia, unspecified: Secondary | ICD-10-CM | POA: Diagnosis not present

## 2020-09-21 ENCOUNTER — Other Ambulatory Visit: Payer: Self-pay | Admitting: Family Medicine

## 2020-09-21 DIAGNOSIS — Z1231 Encounter for screening mammogram for malignant neoplasm of breast: Secondary | ICD-10-CM

## 2020-09-26 DIAGNOSIS — I10 Essential (primary) hypertension: Secondary | ICD-10-CM | POA: Diagnosis not present

## 2020-09-26 DIAGNOSIS — E785 Hyperlipidemia, unspecified: Secondary | ICD-10-CM | POA: Diagnosis not present

## 2020-09-26 DIAGNOSIS — R002 Palpitations: Secondary | ICD-10-CM | POA: Diagnosis not present

## 2020-09-26 DIAGNOSIS — F331 Major depressive disorder, recurrent, moderate: Secondary | ICD-10-CM | POA: Diagnosis not present

## 2020-09-27 ENCOUNTER — Ambulatory Visit: Payer: Medicare Other

## 2020-10-10 ENCOUNTER — Ambulatory Visit: Payer: Medicare Other | Admitting: Physical Therapy

## 2020-10-16 DIAGNOSIS — R109 Unspecified abdominal pain: Secondary | ICD-10-CM | POA: Diagnosis not present

## 2020-10-16 DIAGNOSIS — N23 Unspecified renal colic: Secondary | ICD-10-CM | POA: Diagnosis not present

## 2020-10-16 DIAGNOSIS — Z87442 Personal history of urinary calculi: Secondary | ICD-10-CM | POA: Diagnosis not present

## 2020-10-30 DIAGNOSIS — N200 Calculus of kidney: Secondary | ICD-10-CM | POA: Diagnosis not present

## 2020-10-30 DIAGNOSIS — R1031 Right lower quadrant pain: Secondary | ICD-10-CM | POA: Diagnosis not present

## 2020-11-01 DIAGNOSIS — Z6824 Body mass index (BMI) 24.0-24.9, adult: Secondary | ICD-10-CM | POA: Diagnosis not present

## 2020-11-01 DIAGNOSIS — F331 Major depressive disorder, recurrent, moderate: Secondary | ICD-10-CM | POA: Diagnosis not present

## 2020-11-01 DIAGNOSIS — L299 Pruritus, unspecified: Secondary | ICD-10-CM | POA: Diagnosis not present

## 2020-11-03 DIAGNOSIS — N200 Calculus of kidney: Secondary | ICD-10-CM | POA: Diagnosis not present

## 2020-11-03 DIAGNOSIS — R109 Unspecified abdominal pain: Secondary | ICD-10-CM | POA: Diagnosis not present

## 2020-11-30 DIAGNOSIS — F603 Borderline personality disorder: Secondary | ICD-10-CM | POA: Diagnosis not present

## 2020-11-30 DIAGNOSIS — F331 Major depressive disorder, recurrent, moderate: Secondary | ICD-10-CM | POA: Diagnosis not present

## 2020-11-30 DIAGNOSIS — Z23 Encounter for immunization: Secondary | ICD-10-CM | POA: Diagnosis not present

## 2020-11-30 DIAGNOSIS — Z6824 Body mass index (BMI) 24.0-24.9, adult: Secondary | ICD-10-CM | POA: Diagnosis not present

## 2020-12-15 DIAGNOSIS — N200 Calculus of kidney: Secondary | ICD-10-CM | POA: Diagnosis not present

## 2020-12-15 DIAGNOSIS — R109 Unspecified abdominal pain: Secondary | ICD-10-CM | POA: Diagnosis not present

## 2020-12-22 DIAGNOSIS — R1031 Right lower quadrant pain: Secondary | ICD-10-CM | POA: Diagnosis not present

## 2020-12-22 DIAGNOSIS — N200 Calculus of kidney: Secondary | ICD-10-CM | POA: Diagnosis not present

## 2021-01-13 DIAGNOSIS — Z20822 Contact with and (suspected) exposure to covid-19: Secondary | ICD-10-CM | POA: Diagnosis not present

## 2021-01-13 DIAGNOSIS — R112 Nausea with vomiting, unspecified: Secondary | ICD-10-CM | POA: Diagnosis not present

## 2021-01-13 DIAGNOSIS — K529 Noninfective gastroenteritis and colitis, unspecified: Secondary | ICD-10-CM | POA: Diagnosis not present

## 2021-01-13 DIAGNOSIS — R1031 Right lower quadrant pain: Secondary | ICD-10-CM | POA: Diagnosis not present

## 2021-01-13 DIAGNOSIS — R55 Syncope and collapse: Secondary | ICD-10-CM | POA: Diagnosis not present

## 2021-01-25 DIAGNOSIS — Z6824 Body mass index (BMI) 24.0-24.9, adult: Secondary | ICD-10-CM | POA: Diagnosis not present

## 2021-01-25 DIAGNOSIS — K5904 Chronic idiopathic constipation: Secondary | ICD-10-CM | POA: Diagnosis not present

## 2021-01-25 DIAGNOSIS — R109 Unspecified abdominal pain: Secondary | ICD-10-CM | POA: Diagnosis not present

## 2021-01-25 DIAGNOSIS — Z7689 Persons encountering health services in other specified circumstances: Secondary | ICD-10-CM | POA: Diagnosis not present

## 2021-01-25 DIAGNOSIS — K529 Noninfective gastroenteritis and colitis, unspecified: Secondary | ICD-10-CM | POA: Diagnosis not present

## 2021-03-09 DIAGNOSIS — Z6825 Body mass index (BMI) 25.0-25.9, adult: Secondary | ICD-10-CM | POA: Diagnosis not present

## 2021-03-09 DIAGNOSIS — R1084 Generalized abdominal pain: Secondary | ICD-10-CM | POA: Diagnosis not present

## 2021-04-02 ENCOUNTER — Encounter: Payer: Self-pay | Admitting: Gastroenterology

## 2021-04-02 ENCOUNTER — Ambulatory Visit (INDEPENDENT_AMBULATORY_CARE_PROVIDER_SITE_OTHER): Payer: Medicare Other | Admitting: Gastroenterology

## 2021-04-02 ENCOUNTER — Other Ambulatory Visit: Payer: Self-pay

## 2021-04-02 VITALS — BP 128/82 | HR 85 | Ht 61.0 in | Wt 140.0 lb

## 2021-04-02 DIAGNOSIS — R1084 Generalized abdominal pain: Secondary | ICD-10-CM

## 2021-04-02 DIAGNOSIS — R1319 Other dysphagia: Secondary | ICD-10-CM | POA: Diagnosis not present

## 2021-04-02 DIAGNOSIS — K581 Irritable bowel syndrome with constipation: Secondary | ICD-10-CM | POA: Diagnosis not present

## 2021-04-02 DIAGNOSIS — R140 Abdominal distension (gaseous): Secondary | ICD-10-CM | POA: Diagnosis not present

## 2021-04-02 DIAGNOSIS — K2100 Gastro-esophageal reflux disease with esophagitis, without bleeding: Secondary | ICD-10-CM | POA: Diagnosis not present

## 2021-04-02 MED ORDER — PANTOPRAZOLE SODIUM 40 MG PO TBEC: 40.0000 mg | DELAYED_RELEASE_TABLET | Freq: Every day | ORAL | 4 refills | Status: DC

## 2021-04-02 MED ORDER — LINACLOTIDE 72 MCG PO CAPS: 72.0000 ug | ORAL_CAPSULE | Freq: Every day | ORAL | 4 refills | Status: DC

## 2021-04-02 NOTE — Progress Notes (Signed)
Chief Complaint: FU  Referring Provider:  Marco Collie, MD      ASSESSMENT AND PLAN;   #1. Abdominal bloating #2. IBS-C with #1. #3. Recent H/O "enteritis" Nov 2022 on CT A/P at [facility], treated with Levaquin.  Report awaited.  Doubt Crohn's. #4. GERD with esophageal dysphagia (resolved)  Plan: - CBC, CMP, TSH, CRP, celiac serology - Resume Protonix 40mg  po qd #90, 4 refills - Continue Linzess 72 mcg po QD #90, 4 RF - Continue Miralax 17g po qd PRN - GES to rule out gastroparesis - If still with abdominal bloating, SIBO breath test - [facility] records (esp CT report)     HPI:    Tara Schmidt is a 41 y.o. female   For follow-up visit  Seen in ED at [facility] Nov 2022 (after Thanksgiving) with " severe" lower abdominal pain.  Had elevated WBC count.  CT Abdo/pelvis showed "enteritis".  She was treated with Levaquin x 10 days with good results.  She did not have diarrhea but was more constipated.  Had nausea-Phenergan helped but not Zofran.  Records are awaited.  She continued to have abdominal bloating.  Seen by Dr. Marco Collie.  Started on Linzess 72 mcg po QD with excellent results.  Her bloating is better.  She is having bowel movements 1/day.  Comes to GI clinic for follow-up visit.  She denies having any nausea, vomiting currently.  Feels like food "sits in her stomach after eating".  She does burp up food which she has eaten 24 hours before. Has been having heartburn-stopped Protonix as she ran out of prescription Occasional problems swallowing but better when she was on Protonix.  Had negative EGD/colonoscopy as below.  No recent weight loss.  Sodas 2 cans/day, cherry vanilla coke. No chewing gum  No problems with milk/cheese/gluten.   No wt loss.  Addendum: Records from [facility] ED 04/29/2018: Hb 12.7, MCV 93, platelets 371, WBC count 9.9. UA- neg, normal CMP except for glucose 154, normal lipase at 75.  CT 04/04/2018 (with IV contrast): Mild fatty liver, tiny  nonobstructing right renal calculus, small left ovarian cyst measuring 2.8 cm.  Otherwise negative CT.    Past GI WU:  Colon 06/2018 -Mild pancolonic diverticulosis -Small internal hemorrhoids. -Otherwise normal colonoscopy to TI. -Rpt in 10 yrs. earlier, if with any new problems or change in family history.   EGD 06/2018 -Mildly tortuous esophagus s/p empiric esophageal dilatation. -Mild gastritis. -Neg SB Bx, Neg eso Bx for EoE, Neg Bx for HP.  Past Medical History:  Diagnosis Date   Abnormal tympanic membrane of both ears    Acid reflux    Anxiety    Asthma    Depression    History of TMJ disorder    Hypertension    Hypoglycemia    Sacroiliitis (HCC)    Suicide attempt ([place])    x 3   Syncope and collapse     Past Surgical History:  Procedure Laterality Date   ABDOMINAL HYSTERECTOMY     partical hysterectomy and right ovary. Currently have left ovary   WISDOM TOOTH EXTRACTION     all 3   Current Outpatient Medications on File Prior to Visit  Medication Sig Dispense Refill   AMBULATORY NON FORMULARY MEDICATION Allergy shot with Dr Nyra Capes 2 times a week     doxepin (SINEQUAN) 25 MG capsule      EPINEPHrine 0.3 mg/0.3 mL IJ SOAJ injection USE AS DIRECTED AS NEEDED     GAVILAX  powder TAKE 17 GRAMS EVERY DAY     hydrOXYzine (ATARAX/VISTARIL) 25 MG tablet Take 1 tablet (25 mg total) by mouth every 6 (six) hours as needed for anxiety (Sleep). 30 tablet 0   LAMICTAL ODT 100 MG TBDP Take 1 tablet by mouth 2 (two) times daily.     linaclotide (LINZESS) 72 MCG capsule Take 72 mcg by mouth daily before breakfast.     salmeterol (SEREVENT) 50 MCG/DOSE diskus inhaler 1 puff daily.     simvastatin (ZOCOR) 10 MG tablet Take 10 mg by mouth daily.     No current facility-administered medications on file prior to visit.     Family History  Problem Relation Age of Onset   Heart disease Father    Hypertension Father    Multiple sclerosis Father    Prostate cancer Maternal  Grandfather    Cancer Paternal Grandfather        possibly esophageal   Esophageal cancer Paternal Grandfather    Colon cancer Neg Hx    Rectal cancer Neg Hx    Stomach cancer Neg Hx     Social History   Tobacco Use   Smoking status: Never   Smokeless tobacco: Never  Vaping Use   Vaping Use: Never used  Substance Use Topics   Alcohol use: No   Drug use: No     Allergies  Allergen Reactions   Other Hives and Other (See Comments)    Seafood - throat closes up Seasonal allergies, dogs, cats Corn, tomoato   Penicillins Swelling    Throat, tongue, rash    Sertraline Rash and Swelling    Tongue, throat and rash     Sertraline Hcl Hives and Rash   Effexor [Venlafaxine] Hives   Latex Hives   Septra [Sulfamethoxazole-Trimethoprim] Other (See Comments)    GI upset   Adhesive [Tape] Hives and Rash   Azithromycin Rash   Citalopram Rash    Review of Systems:  Constitutional: Denies fever, chills, diaphoresis, appetite change and fatigue.  HEENT: Denies photophobia, eye pain, redness, hearing loss, ear pain, congestion, sore throat, rhinorrhea, sneezing, mouth sores, neck pain, neck stiffness and tinnitus.   Respiratory: Denies SOB, DOE, cough, chest tightness,  and wheezing.   Cardiovascular: Denies chest pain, palpitations and leg swelling.  Genitourinary: Denies dysuria, urgency, frequency, hematuria, flank pain and difficulty urinating.  Musculoskeletal: Denies myalgias, back pain, joint swelling, arthralgias and gait problem.  Skin: No rash.  Neurological: Denies dizziness, seizures, syncope, weakness, light-headedness, numbness and headaches.  Hematological: Denies adenopathy. Easy bruising, personal or family bleeding history  Psychiatric/Behavioral: has anxiety/depression     Physical Exam:    BP 128/82    Pulse 85    Ht 5\' 1"  (1.549 m)    Wt 140 lb (63.5 kg)    LMP 02/19/2007 (Approximate)    SpO2 95%    BMI 26.45 kg/m  Filed Weights   04/02/21 1559  Weight:  140 lb (63.5 kg)   Gen: awake, alert, NAD HEENT: anicteric, no pallor CV: RRR, no mrg Pulm: CTA b/l Abd: soft, NT/ND, +BS throughout Ext: no c/c/e Neuro: nonfocal  Data Reviewed: I have personally reviewed following labs and imaging studies  CBC: CBC Latest Ref Rng & Units 06/11/2015  WBC 4.0 - 10.5 K/uL 11.0(H)  Hemoglobin 12.0 - 15.0 g/dL 12.9  Hematocrit 36.0 - 46.0 % 37.5  Platelets 150 - 400 K/uL 325    CMP: CMP Latest Ref Rng & Units 06/11/2015  Glucose 65 - 99  mg/dL 91  BUN 6 - 20 mg/dL 11  Creatinine 0.44 - 1.00 mg/dL 0.91  Sodium 135 - 145 mmol/L 140  Potassium 3.5 - 5.1 mmol/L 3.9  Chloride 101 - 111 mmol/L 109  CO2 22 - 32 mmol/L 27  Calcium 8.9 - 10.3 mg/dL 9.2  Total Protein 6.5 - 8.1 g/dL 7.7  Total Bilirubin 0.3 - 1.2 mg/dL 0.4  Alkaline Phos 38 - 126 U/L 67  AST 15 - 41 U/L 21  ALT 14 - 54 U/L 18        Carmell Austria, MD 04/02/2021, 4:04 PM  Cc: Marco Collie, MD

## 2021-04-02 NOTE — Patient Instructions (Signed)
If you are age 43 or older, your body mass index should be between 23-30. Your Body mass index is 26.45 kg/m. If this is out of the aforementioned range listed, please consider follow up with your Primary Care Provider.  If you are age 14 or younger, your body mass index should be between 19-25. Your Body mass index is 26.45 kg/m. If this is out of the aformentioned range listed, please consider follow up with your Primary Care Provider.   __________________________________________________________  The [facility] providers would like to encourage you to use [facility] to communicate with providers for non-urgent requests or questions.  Due to long hold times on the telephone, sending your provider a message by [facility] may be a faster and more efficient way to get a response.  Please allow 48 business hours for a response.  Please remember that this is for non-urgent requests.   Please go to the lab on the 2nd floor [address] as soon as possible.  You will receive a call from [facility] scheduling to schedule your Gastric Emptying study. If you have not heard from them with in 2 days please call [phone].  We have sent the following medications to your pharmacy for you to pick up at your convenience:  Protonix 40 mg Linzess 44mcg  Take Miralax 1 capful mixed in 8 ounces of water   It was a pleasure to see you today!  Jackquline Denmark, M.D.

## 2021-04-03 ENCOUNTER — Other Ambulatory Visit (INDEPENDENT_AMBULATORY_CARE_PROVIDER_SITE_OTHER): Payer: Medicare Other

## 2021-04-03 DIAGNOSIS — K581 Irritable bowel syndrome with constipation: Secondary | ICD-10-CM | POA: Diagnosis not present

## 2021-04-03 DIAGNOSIS — R1084 Generalized abdominal pain: Secondary | ICD-10-CM

## 2021-04-03 DIAGNOSIS — R140 Abdominal distension (gaseous): Secondary | ICD-10-CM | POA: Diagnosis not present

## 2021-04-03 NOTE — Addendum Note (Signed)
Addended by: Lanny Hurst A on: 04/03/2021 03:31 PM   Modules accepted: Orders

## 2021-04-04 LAB — COMPREHENSIVE METABOLIC PANEL
ALT: 18 U/L (ref 0–35)
AST: 20 U/L (ref 0–37)
Albumin: 4.5 g/dL (ref 3.5–5.2)
Alkaline Phosphatase: 67 U/L (ref 39–117)
BUN: 11 mg/dL (ref 6–23)
CO2: 25 mEq/L (ref 19–32)
Calcium: 9.2 mg/dL (ref 8.4–10.5)
Chloride: 102 mEq/L (ref 96–112)
Creatinine, Ser: 0.81 mg/dL (ref 0.40–1.20)
GFR: 89.15 mL/min (ref >60.00)
Glucose, Bld: 80 mg/dL (ref 70–99)
Potassium: 3.9 mEq/L (ref 3.5–5.1)
Sodium: 137 mEq/L (ref 135–145)
Total Bilirubin: 0.8 mg/dL (ref 0.2–1.2)
Total Protein: 7.8 g/dL (ref 6.0–8.3)

## 2021-04-04 LAB — CBC
HCT: 37.3 % (ref 36.0–46.0)
Hemoglobin: 12.5 g/dL (ref 12.0–15.0)
MCHC: 33.6 g/dL (ref 30.0–36.0)
MCV: 87.6 fl (ref 78.0–100.0)
Platelets: 312 10*3/uL (ref 150.0–400.0)
RBC: 4.26 Mil/uL (ref 3.87–5.11)
RDW: 12.1 % (ref 11.5–15.5)
WBC: 8.5 10*3/uL (ref 4.0–10.5)

## 2021-04-04 LAB — TSH: TSH: 2.88 u[IU]/mL (ref 0.35–5.50)

## 2021-04-04 LAB — C-REACTIVE PROTEIN: CRP: 1.2 mg/dL (ref 0.5–20.0)

## 2021-04-05 LAB — CELIAC PANEL 10
Antigliadin Abs, IgA: 5 units (ref 0–19)
Endomysial IgA: NEGATIVE
Gliadin IgG: 4 units (ref 0–19)
IgA/Immunoglobulin A, Serum: 311 mg/dL (ref 87–352)
Tissue Transglut Ab: <2 U/mL (ref 0–5)
Transglutaminase IgA: <2 U/mL (ref 0–3)

## 2021-04-06 ENCOUNTER — Telehealth: Payer: Self-pay

## 2021-04-06 NOTE — Telephone Encounter (Signed)
-----   Message from Jackquline Denmark, MD sent at 04/06/2021  2:54 PM EST ----- Please inform the patient. All results normal or at baseline. Send report to family physician

## 2021-04-06 NOTE — Telephone Encounter (Signed)
Informed the patient all results are normal. Reports faxed to Dr. Nyra Capes.

## 2021-04-10 ENCOUNTER — Encounter (HOSPITAL_COMMUNITY)
Admission: RE | Admit: 2021-04-10 | Discharge: 2021-04-10 | Disposition: A | Discharge status: Home / Self Care | Payer: Medicare Other | Source: Ambulatory Visit | Attending: Gastroenterology | Admitting: Gastroenterology

## 2021-04-10 ENCOUNTER — Other Ambulatory Visit: Payer: Self-pay

## 2021-04-10 DIAGNOSIS — R1084 Generalized abdominal pain: Secondary | ICD-10-CM | POA: Diagnosis not present

## 2021-04-10 DIAGNOSIS — K581 Irritable bowel syndrome with constipation: Secondary | ICD-10-CM | POA: Diagnosis not present

## 2021-04-10 DIAGNOSIS — K5900 Constipation, unspecified: Secondary | ICD-10-CM | POA: Diagnosis not present

## 2021-04-10 DIAGNOSIS — R140 Abdominal distension (gaseous): Secondary | ICD-10-CM | POA: Diagnosis not present

## 2021-04-10 DIAGNOSIS — R1012 Left upper quadrant pain: Secondary | ICD-10-CM | POA: Diagnosis not present

## 2021-04-10 MED ORDER — TECHNETIUM TC 99M SULFUR COLLOID
2.1000 | Freq: Once | INTRAVENOUS | Status: AC | PRN
  Administered 2021-04-10: 2.1 via INTRAVENOUS

## 2021-04-17 NOTE — Telephone Encounter (Signed)
Report was sent to PCP Pt made aware of results of Gastric Emptying Scan   Pt verbalized understanding with all questions answered.

## 2021-04-17 NOTE — Telephone Encounter (Signed)
Patient is returning your call.  

## 2021-05-08 DIAGNOSIS — M545 Low back pain, unspecified: Secondary | ICD-10-CM | POA: Diagnosis not present

## 2021-06-07 ENCOUNTER — Other Ambulatory Visit: Payer: Self-pay

## 2021-06-07 DIAGNOSIS — M5416 Radiculopathy, lumbar region: Secondary | ICD-10-CM | POA: Diagnosis not present

## 2021-06-07 DIAGNOSIS — Z6825 Body mass index (BMI) 25.0-25.9, adult: Secondary | ICD-10-CM | POA: Diagnosis not present

## 2021-06-07 MED ORDER — LINACLOTIDE 72 MCG PO CAPS: 72.0000 ug | ORAL_CAPSULE | Freq: Every day | ORAL | 4 refills | Status: DC

## 2021-06-12 DIAGNOSIS — M2560 Stiffness of unspecified joint, not elsewhere classified: Secondary | ICD-10-CM | POA: Diagnosis not present

## 2021-06-12 DIAGNOSIS — M5416 Radiculopathy, lumbar region: Secondary | ICD-10-CM | POA: Diagnosis not present

## 2021-06-15 DIAGNOSIS — M5416 Radiculopathy, lumbar region: Secondary | ICD-10-CM | POA: Diagnosis not present

## 2021-06-15 DIAGNOSIS — R202 Paresthesia of skin: Secondary | ICD-10-CM | POA: Diagnosis not present

## 2021-06-15 DIAGNOSIS — M2560 Stiffness of unspecified joint, not elsewhere classified: Secondary | ICD-10-CM | POA: Diagnosis not present

## 2021-06-15 DIAGNOSIS — R531 Weakness: Secondary | ICD-10-CM | POA: Diagnosis not present

## 2021-06-20 DIAGNOSIS — R531 Weakness: Secondary | ICD-10-CM | POA: Diagnosis not present

## 2021-06-20 DIAGNOSIS — R202 Paresthesia of skin: Secondary | ICD-10-CM | POA: Diagnosis not present

## 2021-06-20 DIAGNOSIS — M2560 Stiffness of unspecified joint, not elsewhere classified: Secondary | ICD-10-CM | POA: Diagnosis not present

## 2021-06-20 DIAGNOSIS — M5416 Radiculopathy, lumbar region: Secondary | ICD-10-CM | POA: Diagnosis not present

## 2021-06-27 DIAGNOSIS — R531 Weakness: Secondary | ICD-10-CM | POA: Diagnosis not present

## 2021-06-27 DIAGNOSIS — R202 Paresthesia of skin: Secondary | ICD-10-CM | POA: Diagnosis not present

## 2021-06-27 DIAGNOSIS — M2560 Stiffness of unspecified joint, not elsewhere classified: Secondary | ICD-10-CM | POA: Diagnosis not present

## 2021-06-27 DIAGNOSIS — M5416 Radiculopathy, lumbar region: Secondary | ICD-10-CM | POA: Diagnosis not present

## 2021-07-02 DIAGNOSIS — R531 Weakness: Secondary | ICD-10-CM | POA: Diagnosis not present

## 2021-07-02 DIAGNOSIS — R202 Paresthesia of skin: Secondary | ICD-10-CM | POA: Diagnosis not present

## 2021-07-02 DIAGNOSIS — M5416 Radiculopathy, lumbar region: Secondary | ICD-10-CM | POA: Diagnosis not present

## 2021-07-02 DIAGNOSIS — M2560 Stiffness of unspecified joint, not elsewhere classified: Secondary | ICD-10-CM | POA: Diagnosis not present

## 2021-07-04 DIAGNOSIS — Z6826 Body mass index (BMI) 26.0-26.9, adult: Secondary | ICD-10-CM | POA: Diagnosis not present

## 2021-07-04 DIAGNOSIS — L918 Other hypertrophic disorders of the skin: Secondary | ICD-10-CM | POA: Diagnosis not present

## 2021-07-06 DIAGNOSIS — M5416 Radiculopathy, lumbar region: Secondary | ICD-10-CM | POA: Diagnosis not present

## 2021-07-06 DIAGNOSIS — R531 Weakness: Secondary | ICD-10-CM | POA: Diagnosis not present

## 2021-07-06 DIAGNOSIS — M2560 Stiffness of unspecified joint, not elsewhere classified: Secondary | ICD-10-CM | POA: Diagnosis not present

## 2021-07-06 DIAGNOSIS — R202 Paresthesia of skin: Secondary | ICD-10-CM | POA: Diagnosis not present

## 2021-08-03 DIAGNOSIS — Z20822 Contact with and (suspected) exposure to covid-19: Secondary | ICD-10-CM | POA: Diagnosis not present

## 2021-08-03 DIAGNOSIS — R519 Headache, unspecified: Secondary | ICD-10-CM | POA: Diagnosis not present

## 2021-08-06 ENCOUNTER — Other Ambulatory Visit: Payer: Self-pay | Admitting: Gastroenterology

## 2021-08-06 ENCOUNTER — Ambulatory Visit
Admission: RE | Admit: 2021-08-06 | Discharge: 2021-08-06 | Disposition: A | Discharge status: Home / Self Care | Payer: Medicare Other | Source: Ambulatory Visit | Attending: Family Medicine | Admitting: Family Medicine

## 2021-08-06 DIAGNOSIS — Z1231 Encounter for screening mammogram for malignant neoplasm of breast: Secondary | ICD-10-CM

## 2021-08-08 DIAGNOSIS — D485 Neoplasm of uncertain behavior of skin: Secondary | ICD-10-CM | POA: Diagnosis not present

## 2021-08-08 DIAGNOSIS — L821 Other seborrheic keratosis: Secondary | ICD-10-CM | POA: Diagnosis not present

## 2021-08-08 DIAGNOSIS — L814 Other melanin hyperpigmentation: Secondary | ICD-10-CM | POA: Diagnosis not present

## 2021-08-08 DIAGNOSIS — L719 Rosacea, unspecified: Secondary | ICD-10-CM | POA: Diagnosis not present

## 2021-08-08 DIAGNOSIS — D225 Melanocytic nevi of trunk: Secondary | ICD-10-CM | POA: Diagnosis not present

## 2021-09-03 DIAGNOSIS — H02843 Edema of right eye, unspecified eyelid: Secondary | ICD-10-CM | POA: Diagnosis not present

## 2021-09-03 DIAGNOSIS — H1011 Acute atopic conjunctivitis, right eye: Secondary | ICD-10-CM | POA: Diagnosis not present

## 2021-09-04 DIAGNOSIS — Z6825 Body mass index (BMI) 25.0-25.9, adult: Secondary | ICD-10-CM | POA: Diagnosis not present

## 2021-09-04 DIAGNOSIS — J3089 Other allergic rhinitis: Secondary | ICD-10-CM | POA: Diagnosis not present

## 2021-09-04 DIAGNOSIS — Z1231 Encounter for screening mammogram for malignant neoplasm of breast: Secondary | ICD-10-CM | POA: Diagnosis not present

## 2021-09-12 DIAGNOSIS — E785 Hyperlipidemia, unspecified: Secondary | ICD-10-CM | POA: Diagnosis not present

## 2021-09-20 DIAGNOSIS — Z1389 Encounter for screening for other disorder: Secondary | ICD-10-CM | POA: Diagnosis not present

## 2021-09-20 DIAGNOSIS — Z1331 Encounter for screening for depression: Secondary | ICD-10-CM | POA: Diagnosis not present

## 2021-09-20 DIAGNOSIS — Z0000 Encounter for general adult medical examination without abnormal findings: Secondary | ICD-10-CM | POA: Diagnosis not present

## 2021-09-20 DIAGNOSIS — Z01419 Encounter for gynecological examination (general) (routine) without abnormal findings: Secondary | ICD-10-CM | POA: Diagnosis not present

## 2021-09-20 DIAGNOSIS — Z139 Encounter for screening, unspecified: Secondary | ICD-10-CM | POA: Diagnosis not present

## 2021-09-20 DIAGNOSIS — E785 Hyperlipidemia, unspecified: Secondary | ICD-10-CM | POA: Diagnosis not present

## 2021-09-20 DIAGNOSIS — Z720 Tobacco use: Secondary | ICD-10-CM | POA: Diagnosis not present

## 2021-09-20 DIAGNOSIS — Z6826 Body mass index (BMI) 26.0-26.9, adult: Secondary | ICD-10-CM | POA: Diagnosis not present

## 2021-09-20 DIAGNOSIS — K5904 Chronic idiopathic constipation: Secondary | ICD-10-CM | POA: Diagnosis not present

## 2021-09-20 DIAGNOSIS — Z1339 Encounter for screening examination for other mental health and behavioral disorders: Secondary | ICD-10-CM | POA: Diagnosis not present

## 2021-09-20 DIAGNOSIS — Z23 Encounter for immunization: Secondary | ICD-10-CM | POA: Diagnosis not present

## 2021-09-20 DIAGNOSIS — Z136 Encounter for screening for cardiovascular disorders: Secondary | ICD-10-CM | POA: Diagnosis not present

## 2021-09-24 ENCOUNTER — Other Ambulatory Visit: Payer: Self-pay | Admitting: Gastroenterology

## 2021-10-03 DIAGNOSIS — R079 Chest pain, unspecified: Secondary | ICD-10-CM | POA: Diagnosis not present

## 2021-10-03 DIAGNOSIS — E7800 Pure hypercholesterolemia, unspecified: Secondary | ICD-10-CM | POA: Diagnosis not present

## 2021-10-03 DIAGNOSIS — R0789 Other chest pain: Secondary | ICD-10-CM | POA: Diagnosis not present

## 2021-10-03 DIAGNOSIS — I10 Essential (primary) hypertension: Secondary | ICD-10-CM | POA: Diagnosis not present

## 2021-10-16 ENCOUNTER — Ambulatory Visit (INDEPENDENT_AMBULATORY_CARE_PROVIDER_SITE_OTHER): Payer: Medicare Other | Admitting: Allergy

## 2021-10-16 ENCOUNTER — Encounter: Payer: Self-pay | Admitting: Allergy

## 2021-10-16 VITALS — BP 118/72 | HR 85 | Resp 16 | Ht 61.0 in | Wt 141.8 lb

## 2021-10-16 DIAGNOSIS — T781XXA Other adverse food reactions, not elsewhere classified, initial encounter: Secondary | ICD-10-CM | POA: Diagnosis not present

## 2021-10-16 DIAGNOSIS — J3089 Other allergic rhinitis: Secondary | ICD-10-CM | POA: Diagnosis not present

## 2021-10-16 DIAGNOSIS — T781XXD Other adverse food reactions, not elsewhere classified, subsequent encounter: Secondary | ICD-10-CM

## 2021-10-16 DIAGNOSIS — H1013 Acute atopic conjunctivitis, bilateral: Secondary | ICD-10-CM

## 2021-10-16 DIAGNOSIS — T783XXD Angioneurotic edema, subsequent encounter: Secondary | ICD-10-CM

## 2021-10-16 DIAGNOSIS — L509 Urticaria, unspecified: Secondary | ICD-10-CM

## 2021-10-16 DIAGNOSIS — J4540 Moderate persistent asthma, uncomplicated: Secondary | ICD-10-CM | POA: Diagnosis not present

## 2021-10-16 DIAGNOSIS — L299 Pruritus, unspecified: Secondary | ICD-10-CM

## 2021-10-16 MED ORDER — AZELASTINE HCL 0.1 % NA SOLN: NASAL | 5 refills | Status: AC

## 2021-10-16 MED ORDER — BUDESONIDE-FORMOTEROL FUMARATE 160-4.5 MCG/ACT IN AERO: INHALATION_SPRAY | RESPIRATORY_TRACT | 5 refills | Status: DC

## 2021-10-16 MED ORDER — FLUTICASONE PROPIONATE 50 MCG/ACT NA SUSP: NASAL | 5 refills | Status: DC

## 2021-10-16 MED ORDER — FAMOTIDINE 20 MG PO TABS: ORAL_TABLET | ORAL | 5 refills | Status: DC

## 2021-10-16 NOTE — Progress Notes (Signed)
New Patient Note  RE: Tara Schmidt MRN: [id number] DOB: 17-Feb-1981 Date of Office Visit: 10/16/2021  Primary care provider: Marco Collie, MD  Chief Complaint: allergies, asthma, hives  History of present illness: Tara Schmidt is a 41 y.o. female presenting today for evaluation of allergic rhinitis.    She reports symptoms of itchy/watery eyes, runny/stuffy nose and sneezing that are year-round mostly.  This year she states her eye symptoms have been worse where she has had eye swelling and notes lots of itching and rubbing the eyes.  She did go to UC for eye symptoms and was advised to take an antihistamine eye drop that is OTC.  She got an option that was twice a day that she is using that helps for control.  She does take loratadine daily for years and does find it helpful as had had more symptoms since being off for today's visit. The loratadine does not help with itching however.  She uses flonase as needed for congestion and does help.  Has not tried nasal saline rinse.  She states her PCP did do allergy testing and did do about 6 months of allergy shots about 5 years ago so.  She is not sure why she stopped (unsure if it was an insurance issue or an office issue).   She has all over itching and is on doxepin 2 tablets at bedtime. She states it does not make her sleepy but it does help with the itching.  She states she has been told she has "hypersensitive skin" as if she strokes the skin it does hive up and itch.  She does report having random hives and red, hot, itchy rashes that can start on face and extend down to chest.  She does take hydroxyzine as needed about 2-3 times a month for anxiety/itching.  She states some medications have caused hives.  Otherwise has not identified other specific triggers.  However states she has had food allergy testing for this and did have positive results for  pork, kidney beans, flounder, cinnamon, onions.  She has cut these foods out of the diet.  She  however still gets the random rashes as above. She believes last testing was about a year ago.  She states she has had 2 tests and states both times testing results were different.  She has an epipen but states likely expired.   She has history of asthma diagnosed in adulthood.  Symptoms include wheezing, shortness of breath primarily.  She states laughing can trigger asthma symptoms. Talking a lot she can get out of breath.  She has had a handicap placard as even walking from the parking lot to the store can get out of breath.  She states she uses serevent daily and will use albuterol as needed.  Has not had any other asthma medications.    Review of systems: Review of Systems  Constitutional: Negative.   HENT:         See HPI  Eyes:        See HPI  Respiratory:         See HPI  Cardiovascular: Negative.   Gastrointestinal: Negative.   Musculoskeletal: Negative.   Skin:        See HPI  Allergic/Immunologic: Negative.   Neurological: Negative.     All other systems negative unless noted above in HPI  Past medical history: Past Medical History:  Diagnosis Date  . Abnormal tympanic membrane of both ears   . Acid reflux   .  Anxiety   . Asthma   . Depression   . History of TMJ disorder   . Hypertension   . Hypoglycemia   . Sacroiliitis ([place])   . Suicide attempt ([place])    x 3  . Syncope and collapse   . Urticaria     Past surgical history: Past Surgical History:  Procedure Laterality Date  . ABDOMINAL HYSTERECTOMY     partical hysterectomy and right ovary. Currently have left ovary  . WISDOM TOOTH EXTRACTION     all 3    Family history:  Family History  Problem Relation Age of Onset  . Heart disease Father   . Hypertension Father   . Multiple sclerosis Father   . Prostate cancer Maternal Grandfather   . Cancer Paternal Grandfather        possibly esophageal  . Esophageal cancer Paternal Grandfather   . Colon cancer Neg Hx   . Rectal cancer Neg Hx   . Stomach  cancer Neg Hx   . Breast cancer Neg Hx     Social history: Lives in a home with carpeting in the bedroom with gas heating and central cooling.  Dogs and a cat in the home.  There is no concern for water damage, mildew or roaches in the home.  She does not report an occupation at this time.  She denies any history.   Medication List: Current Outpatient Medications  Medication Sig Dispense Refill  . albuterol (PROVENTIL HFA) 108 (90 Base) MCG/ACT inhaler Inhale 2 puffs every 4 hours by inhalation route.    . doxepin (SINEQUAN) 25 MG capsule     . EPINEPHrine 0.3 mg/0.3 mL IJ SOAJ injection USE AS DIRECTED AS NEEDED    . linaclotide (LINZESS) 72 MCG capsule Take 1 capsule (72 mcg total) by mouth daily before breakfast. 90 capsule 4  . loratadine (CLARITIN) 10 MG tablet Take 1 tablet by mouth daily.    . pantoprazole (PROTONIX) 40 MG tablet TAKE ONE TABLET BY MOUTH DAILY 90 tablet 4  . salmeterol (SEREVENT) 50 MCG/DOSE diskus inhaler 1 puff daily.    . simvastatin (ZOCOR) 10 MG tablet Take 10 mg by mouth daily.     No current facility-administered medications for this visit.    Known medication allergies: Allergies  Allergen Reactions  . Other Hives and Other (See Comments)    Seafood - throat closes up Seasonal allergies, dogs, cats Corn, tomoato  . Penicillins Swelling    Throat, tongue, rash   . Sertraline Rash and Swelling    Tongue, throat and rash    . Sertraline Hcl Hives and Rash  . Effexor [Venlafaxine] Hives  . Latex Hives  . Septra [Sulfamethoxazole-Trimethoprim] Other (See Comments)    GI upset  . Adhesive [Tape] Hives and Rash  . Azithromycin Rash  . Citalopram Rash     Physical examination: Blood pressure 118/72, pulse 85, resp. rate 16, height '5\' 1"'$  (1.549 m), weight 141 lb 12.8 oz (64.3 kg), last menstrual period 02/19/2007, SpO2 96 %.  General: Alert, interactive, in no acute distress. HEENT: PERRLA, TMs pearly gray, turbinates minimally edematous  without discharge, post-pharynx non erythematous. Neck: Supple without lymphadenopathy. Lungs: Clear to auscultation without wheezing, rhonchi or rales. {no increased work of breathing. CV: Normal S1, S2 without murmurs. Abdomen: Nondistended, nontender. Skin: Warm and dry, without lesions or rashes. Extremities:  No clubbing, cyanosis or edema. Neuro:   Grossly intact.  Diagnositics/Labs:  Spirometry: FEV1: 2.65L 100%, FVC: 3.16L 96%, ratio consistent with  nonobstructive pattern Allergy testing:   Airborne Adult Perc - 10/16/21 1042     Time Antigen Placed 1042    Allergen Manufacturer Lavella Hammock    Location Back    Number of Test 59    Panel 1 Select    1. Control-Buffer 50% Glycerol Negative    2. Control-Histamine 1 mg/ml 2+    3. Albumin saline Negative    4. [place] 2+    5. [place] Negative    6. Johnson Negative    7. [place] Blue Negative    8. Meadow Fescue Negative    9. Perennial Rye Negative    10. Sweet Vernal Negative    11. Timothy Negative    12. Cocklebur Negative    13. Burweed Marshelder Negative    14. Ragweed, short Negative    15. Ragweed, Giant Negative    16. Plantain,  English Negative    17. Lamb's Quarters Negative    18. Sheep Sorrell Negative    19. Rough Pigweed Negative    20. Marsh Elder, Rough Negative    21. Mugwort, Common Negative    22. Ash mix 2+    23. Birch mix 2+    24. Beech American Negative    25. Box, Elder Negative    26. Cedar, red 2+    27. Cottonwood, [place] Negative    28. Elm mix Negative    29. Hickory Negative    30. Maple mix Negative    31. Oak, [place] mix Negative    32. Pecan Pollen 2+    33. Pine mix Negative    34. Sycamore Eastern Negative    35. [place], Black Pollen Negative    36. Alternaria alternata Negative    37. Cladosporium Herbarum Negative    38. Aspergillus mix Negative    39. Penicillium mix Negative    40. Bipolaris sorokiniana (Helminthosporium) Negative    41. Drechslera spicifera  (Curvularia) Negative    42. Mucor plumbeus Negative    43. Fusarium moniliforme Negative    44. Aureobasidium pullulans (pullulara) Negative    45. Rhizopus oryzae Negative    46. Botrytis cinera Negative    47. Epicoccum nigrum Negative    48. Phoma betae Negative    49. Candida Albicans Negative    50. Trichophyton mentagrophytes Negative    51. Mite, D Farinae  5,000 AU/ml 2+    52. Mite, D Pteronyssinus  5,000 AU/ml Negative    53. Cat Hair 10,000 BAU/ml Negative    54.  Dog Epithelia Negative    55. Mixed Feathers Negative    56. Horse Epithelia Negative    57. Cockroach, German Negative    58. Mouse Negative    59. Tobacco Leaf Negative             Intradermal - 10/16/21 1115     Time Antigen Placed 1115    Allergen Manufacturer Lavella Hammock    Location Arm    Number of Test 13    Intradermal Select    Control Negative    Guatem[place] Negative    Johnson Negative    7 Grass Negative    Ragweed mix Negative    Weed mix Negative    Mold 1 Negative    Mold 2 Negative    Mold 3 Negative    Mold 4 2+    Cat Negative    Dog Negative             Food Adult Perc -  10/16/21 1000     Time Antigen Placed 1042    Allergen Manufacturer Lavella Hammock    Location Back    Number of allergen test 5    23. Flounder Negative    37. Pork Negative    46. Navy Bean Negative    49. Onion 2+    67. Cinnamon Negative             Allergy testing results were read and interpreted by provider, documented by clinical staff.   Assessment and plan: Allergic rhinitis with conjunctivitis  - Testing today showed: grasses, trees, outdoor molds, and dust mites. - Copy of test results provided.  - Avoidance measures provided. - Continue with: Claritin (loratadine) '10mg'$  tablet once daily. - Start taking: Flonase (fluticasone) two sprays per nostril daily (AIM FOR EAR ON EACH SIDE) for 1-2 weeks at a time before stopping once nasal congestion improves for maximum benefit. Astelin  (azelastine) 2 sprays per nostril 1-2 times daily as needed for runny nose.  Pataday (olopatadine) one drop per eye daily as needed for itchy/watery eyes.  - You can use an extra dose of the antihistamine, if needed, for breakthrough symptoms.  - Consider nasal saline rinses 1-2 times daily to remove allergens from the nasal cavities as well as help with mucous clearance (this is especially helpful to do before the nasal sprays are given) - Consider allergy shots as a means of long-term control. - Allergy shots "re-train" and "reset" the immune system to ignore environmental allergens and decrease the resulting immune response to those allergens (sneezing, itchy watery eyes, runny nose, nasal congestion, etc).    - Allergy shots improve symptoms in 80-85% of patients.   Mod persistent asthma -  Lung function testing looks great today! . - Stop Serevant - Daily controller medication(s): Symbicort 160/4.57mg two puffs twice daily with spacer - Prior to physical activity: albuterol 2 puffs 10-15 minutes before physical activity. - Rescue medications: albuterol 2 puffs every 4-6 hours as needed - May need to consider an asthma biologic agent like Dupixent if needing to step-up asthma control.  - Asthma control goals:  * Full participation in all desired activities (may need albuterol before activity) * Albuterol use two time or less a week on average (not counting use with activity) * Cough interfering with sleep two time or less a month * Oral steroids no more than once a year * No hospitalizations  Urticaria, angioedema, pruritus Adverse food reaction - Start Pepcid '20mg'$  1 tab twice a day.  This is a secondary antihistamine (H2 blocker) that can help with your primary antihistamines (H1 blockers) of Loratadine, Doxepin with better rash/itch control. - Continue doxepin 2 tabs at bedtime for itch control - Can continue as needed use of hydroxyzine for itch control. - May need to consider  Xolair injections for chronic spontaneous hive control - Food allergy testing today is positive to onion.   Will obtain serum IgE levels for these foods to see if we can expand your diet back - Continue your current food avoidance until advised otherwise - Have access to self-injectable epinephrine (Epipen or AuviQ) 0.'3mg'$  at all times - Follow emergency action plan in case of allergic reaction  Follow-up in 3-4 months or sooner if needed  I appreciate the opportunity to take part in Aleila's care. Please do not hesitate to contact me with questions.  Sincerely,  SPrudy Feeler MD Allergy/Immunology [facility]

## 2021-10-16 NOTE — Patient Instructions (Signed)
-   Testing today showed: grasses, trees, outdoor molds, and dust mites. - Copy of test results provided.  - Avoidance measures provided. - Continue with: Claritin (loratadine) '10mg'$  tablet once daily. - Start taking: Flonase (fluticasone) two sprays per nostril daily (AIM FOR EAR ON EACH SIDE) for 1-2 weeks at a time before stopping once nasal congestion improves for maximum benefit. Astelin (azelastine) 2 sprays per nostril 1-2 times daily as needed for runny nose.  Pataday (olopatadine) one drop per eye daily as needed for itchy/watery eyes.  - You can use an extra dose of the antihistamine, if needed, for breakthrough symptoms.  - Consider nasal saline rinses 1-2 times daily to remove allergens from the nasal cavities as well as help with mucous clearance (this is especially helpful to do before the nasal sprays are given) - Consider allergy shots as a means of long-term control. - Allergy shots "re-train" and "reset" the immune system to ignore environmental allergens and decrease the resulting immune response to those allergens (sneezing, itchy watery eyes, runny nose, nasal congestion, etc).    - Allergy shots improve symptoms in 80-85% of patients.   -  Lung function testing looks great today! . - Stop Serevant - Daily controller medication(s): Symbicort 160/4.76mg two puffs twice daily with spacer - Prior to physical activity: albuterol 2 puffs 10-15 minutes before physical activity. - Rescue medications: albuterol 2 puffs every 4-6 hours as needed - May need to consider an asthma biologic agent like Dupixent if needing to step-up asthma control.  - Asthma control goals:  * Full participation in all desired activities (may need albuterol before activity) * Albuterol use two time or less a week on average (not counting use with activity) * Cough interfering with sleep two time or less a month * Oral steroids no more than once a year * No hospitalizations  - Start Pepcid '20mg'$  1 tab  twice a day.  This is a secondary antihistamine (H2 blocker) that can help with your primary antihistamines (H1 blockers) of Loratadine, Doxepin with better rash/itch control. - Continue doxepin 2 tabs at bedtime for itch control - Can continue as needed use of hydroxyzine for itch control. - May need to consider Xolair injections for chronic spontaneous hive control - Food allergy testing today is positive to onion.   Will obtain serum IgE levels for these foods to see if we can expand your diet back - Continue your current food avoidance until advised otherwise - Have access to self-injectable epinephrine (Epipen or AuviQ) 0.'3mg'$  at all times - Follow emergency action plan in case of allergic reaction  Follow-up in 3-4 months or sooner if needed

## 2021-10-17 ENCOUNTER — Telehealth: Payer: Self-pay | Admitting: Allergy

## 2021-10-17 ENCOUNTER — Other Ambulatory Visit: Payer: Self-pay | Admitting: Gastroenterology

## 2021-10-17 MED ORDER — ALBUTEROL SULFATE HFA 108 (90 BASE) MCG/ACT IN AERS: INHALATION_SPRAY | RESPIRATORY_TRACT | 1 refills | Status: DC

## 2021-10-17 MED ORDER — EPINEPHRINE 0.3 MG/0.3ML IJ SOAJ: INTRAMUSCULAR | 3 refills | Status: DC

## 2021-10-17 NOTE — Telephone Encounter (Signed)
Patient is requesting EpiPen and rescue inhaler sent in to [facility].

## 2021-10-17 NOTE — Telephone Encounter (Signed)
Called patient and informed her that prescriptions have been sent to [facility].

## 2021-10-19 LAB — ALPHA-GAL PANEL
Allergen Lamb IgE: <0.1 kU/L
Beef IgE: <0.1 kU/L
IgE (Immunoglobulin E), Serum: 13 IU/mL (ref 6–495)
O215-IgE Alpha-Gal: <0.1 kU/L
Pork IgE: <0.1 kU/L

## 2021-10-19 LAB — ALLERGEN, FLOUNDER, RF337: Allergen Flounder IgE: <0.1 kU/L

## 2021-10-19 LAB — ALLERGEN, CINNAMON, RF220: Allergen Cinnamon IgE: <0.1 kU/L

## 2021-10-19 LAB — ALLERGEN, KIDNEY BEAN, RF287: Kidney Bean IgE: <0.1 kU/L

## 2021-10-19 LAB — ALLERGEN, ONION, F48: Allergen Onion IgE: <0.1 kU/L

## 2021-10-21 DIAGNOSIS — N281 Cyst of kidney, acquired: Secondary | ICD-10-CM | POA: Diagnosis not present

## 2021-10-21 DIAGNOSIS — Z792 Long term (current) use of antibiotics: Secondary | ICD-10-CM | POA: Diagnosis not present

## 2021-10-21 DIAGNOSIS — Z79899 Other long term (current) drug therapy: Secondary | ICD-10-CM | POA: Diagnosis not present

## 2021-10-21 DIAGNOSIS — K760 Fatty (change of) liver, not elsewhere classified: Secondary | ICD-10-CM | POA: Diagnosis not present

## 2021-10-21 DIAGNOSIS — K529 Noninfective gastroenteritis and colitis, unspecified: Secondary | ICD-10-CM | POA: Diagnosis not present

## 2021-10-21 DIAGNOSIS — N200 Calculus of kidney: Secondary | ICD-10-CM | POA: Diagnosis not present

## 2021-10-23 DIAGNOSIS — K529 Noninfective gastroenteritis and colitis, unspecified: Secondary | ICD-10-CM | POA: Diagnosis not present

## 2021-10-23 DIAGNOSIS — Z6825 Body mass index (BMI) 25.0-25.9, adult: Secondary | ICD-10-CM | POA: Diagnosis not present

## 2021-11-14 ENCOUNTER — Other Ambulatory Visit: Payer: Self-pay | Admitting: Allergy

## 2021-12-28 ENCOUNTER — Ambulatory Visit: Payer: Medicare Other | Admitting: Gastroenterology

## 2022-01-15 ENCOUNTER — Other Ambulatory Visit: Payer: Self-pay

## 2022-01-15 MED ORDER — LINACLOTIDE 72 MCG PO CAPS: ORAL_CAPSULE | ORAL | 4 refills | Status: DC

## 2022-01-15 NOTE — Progress Notes (Signed)
90 day supply request

## 2022-01-15 NOTE — Addendum Note (Signed)
Addended by: Curlene Labrum E on: 01/15/2022 02:31 PM   Modules accepted: Orders

## 2022-01-17 ENCOUNTER — Other Ambulatory Visit: Payer: Self-pay

## 2022-01-17 ENCOUNTER — Telehealth: Payer: Self-pay

## 2022-01-17 MED ORDER — BUDESONIDE-FORMOTEROL FUMARATE 160-4.5 MCG/ACT IN AERO: INHALATION_SPRAY | RESPIRATORY_TRACT | 0 refills | Status: DC

## 2022-01-17 MED ORDER — FAMOTIDINE 20 MG PO TABS: ORAL_TABLET | ORAL | 0 refills | Status: DC

## 2022-01-20 DIAGNOSIS — Z79899 Other long term (current) drug therapy: Secondary | ICD-10-CM | POA: Diagnosis not present

## 2022-01-20 DIAGNOSIS — K760 Fatty (change of) liver, not elsewhere classified: Secondary | ICD-10-CM | POA: Diagnosis not present

## 2022-01-20 DIAGNOSIS — I10 Essential (primary) hypertension: Secondary | ICD-10-CM | POA: Diagnosis not present

## 2022-01-20 DIAGNOSIS — R109 Unspecified abdominal pain: Secondary | ICD-10-CM | POA: Diagnosis not present

## 2022-01-20 DIAGNOSIS — R1011 Right upper quadrant pain: Secondary | ICD-10-CM | POA: Diagnosis not present

## 2022-01-20 DIAGNOSIS — N200 Calculus of kidney: Secondary | ICD-10-CM | POA: Diagnosis not present

## 2022-01-20 DIAGNOSIS — R1010 Upper abdominal pain, unspecified: Secondary | ICD-10-CM | POA: Diagnosis not present

## 2022-01-20 LAB — LAB REPORT - SCANNED

## 2022-01-22 NOTE — Telephone Encounter (Signed)
My chart message sent. done

## 2022-01-28 ENCOUNTER — Telehealth: Payer: Self-pay

## 2022-01-28 NOTE — Telephone Encounter (Signed)
     Patient  visit on 12/4  at [facility]   Have you been able to follow up with your primary care physician? Yes   The patient was or was not able to obtain any needed medicine or equipment. Yes   Are there diet recommendations that you are having difficulty following? Na   Patient expresses understanding of discharge instructions and education provided has no other needs at this time.  Yes      [place], [facility], Care Management  [phone] [address], [place], [place] [address] Phone: [phone] Email: [email or web address]

## 2022-01-29 ENCOUNTER — Ambulatory Visit (INDEPENDENT_AMBULATORY_CARE_PROVIDER_SITE_OTHER): Payer: Medicare Other | Admitting: Allergy

## 2022-01-29 ENCOUNTER — Encounter: Payer: Self-pay | Admitting: Allergy

## 2022-01-29 VITALS — BP 112/70 | HR 89 | Resp 16

## 2022-01-29 DIAGNOSIS — J4540 Moderate persistent asthma, uncomplicated: Secondary | ICD-10-CM | POA: Diagnosis not present

## 2022-01-29 DIAGNOSIS — L299 Pruritus, unspecified: Secondary | ICD-10-CM | POA: Diagnosis not present

## 2022-01-29 DIAGNOSIS — H1013 Acute atopic conjunctivitis, bilateral: Secondary | ICD-10-CM

## 2022-01-29 DIAGNOSIS — L509 Urticaria, unspecified: Secondary | ICD-10-CM | POA: Diagnosis not present

## 2022-01-29 DIAGNOSIS — T783XXD Angioneurotic edema, subsequent encounter: Secondary | ICD-10-CM

## 2022-01-29 DIAGNOSIS — T781XXD Other adverse food reactions, not elsewhere classified, subsequent encounter: Secondary | ICD-10-CM | POA: Diagnosis not present

## 2022-01-29 DIAGNOSIS — J3089 Other allergic rhinitis: Secondary | ICD-10-CM | POA: Diagnosis not present

## 2022-01-29 NOTE — Progress Notes (Signed)
Follow-up Note  RE: Tara Schmidt MRN: [id number] DOB: 1980/07/04 Date of Office Visit: 01/29/2022   History of present illness: Tara Schmidt is a 41 y.o. female presenting today for follow-up of allergic rhinitis with conjunctivitis, asthma, urticaria with angioedema as well adverse food reaction.  She was last seen in the office on 10/16/2021 by myself. She states she has been doing better since the last visit however in regards to her hives and itching and swelling she has still had some episodes.  She is taking Claritin, Pepcid twice a day and doxepin at that time.  She would like to start Xolair at this point.  She is taking the Pepcid for both hive control for potential GI ulcer control. In regards to her asthma she states there have been a few times where she has needed albuterol use.  Colder months is usually the time of the year her asthma has more symptoms.  She has not had any nighttime awakenings nor has she required any ED or urgent care visits or any systemic steroids.  She is using Symbicort 160 mcg taking 2 puffs twice a day and does feel like this is a beneficial inhaler for her. With her allergies she does feel that the combination of Claritin, Flonase, Pataday help to control her symptoms.  She states she did have a lot of sneezing when she went to her mother's home and is not sure what triggered her there.  Review of systems: Review of Systems  Constitutional: Negative.   HENT:         See HPI  Eyes: Negative.   Respiratory: Negative.    Cardiovascular: Negative.   Gastrointestinal: Negative.   Musculoskeletal: Negative.   Skin:        See HPI  Allergic/Immunologic: Negative.   Neurological: Negative.      All other systems negative unless noted above in HPI  Past medical/social/surgical/family history have been reviewed and are unchanged unless specifically indicated below.  No changes  Medication List: Current Outpatient Medications  Medication Sig Dispense  Refill   albuterol (PROVENTIL HFA) 108 (90 Base) MCG/ACT inhaler Can inhale two puffs every four to six hours as needed for cough or wheeze. 1 each 1   azelastine (ASTELIN) 0.1 % nasal spray Use two sprays in each nostril twice daily if needed 30 mL 5   budesonide-formoterol (SYMBICORT) 160-4.5 MCG/ACT inhaler Inhale two puffs twice daily to prevent cough or wheeze. Rinse mouth after use. Use spacer. 10.2 g 0   doxepin (SINEQUAN) 25 MG capsule      EPINEPHrine 0.3 mg/0.3 mL IJ SOAJ injection Use as directed for life-threatening allergic reaction. 2 each 3   famotidine (PEPCID) 20 MG tablet Take one tablet twice daily as directed. 90 tablet 0   fluticasone (FLONASE) 50 MCG/ACT nasal spray Use two sprays in each nostril once daily as directed 16 g 5   linaclotide (LINZESS) 72 MCG capsule TAKE ONE CAPSULE BY MOUTH DAILY before breakfast 90 capsule 4   loratadine (CLARITIN) 10 MG tablet Take 1 tablet by mouth daily.     pantoprazole (PROTONIX) 40 MG tablet TAKE ONE TABLET BY MOUTH DAILY 90 tablet 4   simvastatin (ZOCOR) 10 MG tablet Take 10 mg by mouth daily.     No current facility-administered medications for this visit.     Known medication allergies: Allergies  Allergen Reactions   Other Hives and Other (See Comments)    Seafood - throat closes up Seasonal allergies, dogs, cats  Corn, tomoato   Penicillins Swelling    Throat, tongue, rash    Sertraline Rash and Swelling    Tongue, throat and rash     Sertraline Hcl Hives and Rash   Effexor [Venlafaxine] Hives   Latex Hives   Septra [Sulfamethoxazole-Trimethoprim] Other (See Comments)    GI upset   Adhesive [Tape] Hives and Rash   Azithromycin Rash   Citalopram Rash     Physical examination: Blood pressure 112/70, pulse 89, resp. rate 16, last menstrual period 02/19/2007, SpO2 98 %.  General: Alert, interactive, in no acute distress. HEENT: PERRLA, TMs pearly gray, turbinates non-edematous without discharge, post-pharynx non  erythematous. Neck: Supple without lymphadenopathy. Lungs: Clear to auscultation without wheezing, rhonchi or rales. {no increased work of breathing. CV: Normal S1, S2 without murmurs. Abdomen: Nondistended, nontender. Skin: Warm and dry, without lesions or rashes. Extremities:  No clubbing, cyanosis or edema. Neuro:   Grossly intact.  Diagnositics/Labs: Labs:  Component     Latest Ref Rng 10/16/2021  IgE (Immunoglobulin E), Serum     6 - 495 IU/mL 13   [id number] Alpha-Gal     Class 0 kU/L <0.10   Beef IgE     Class 0 kU/L <0.10   Pork IgE     Class 0 kU/L <0.10   Allergen Lamb IgE     Class 0 kU/L <0.10   Allergen Onion IgE     Class 0 kU/L <0.10   Kidney Bean IgE     Class 0 kU/L <0.10   Allergen Cinnamon IgE     Class 0 kU/L <0.10   Allergen Flounder IgE     Class 0 kU/L <0.10     Assessment and plan: Allergic rhinitis with conjunctivtis - Continue avoidance measures for grasses, trees, outdoor molds, and dust mites. - Continue with: Claritin (loratadine) '10mg'$  tablet once daily. Flonase (fluticasone) two sprays per nostril daily (AIM FOR EAR ON EACH SIDE) for 1-2 weeks at a time before stopping once nasal congestion improves for maximum benefit. Astelin (azelastine) 2 sprays per nostril 1-2 times daily as needed for runny nose.  Pataday (olopatadine) one drop per eye daily as needed for itchy/watery eyes.  - You can use an extra dose of the antihistamine, if needed, for breakthrough symptoms.  - Consider nasal saline rinses 1-2 times daily to remove allergens from the nasal cavities as well as help with mucous clearance (this is especially helpful to do before the nasal sprays are given) - Consider allergy shots as a means of long-term control if medication management is not effective enough.  Allergy shots "re-train" and "reset" the immune system to ignore environmental allergens and decrease the resulting immune response to those allergens (sneezing, itchy watery eyes,  runny nose, nasal congestion, etc).  Allergy shots improve symptoms in 80-85% of patients.   Mod persistent asthma - Daily controller medication(s): Symbicort 160/4.40mg two puffs twice daily with spacer - Prior to physical activity: albuterol 2 puffs 10-15 minutes before physical activity. - Rescue medications: albuterol 2 puffs every 4-6 hours as needed OR Symbicort 2 puffs (up to 6-8 additional puffs/day can be use as a rescue med)  - Asthma control goals:  * Full participation in all desired activities (may need albuterol before activity) * Albuterol use two time or less a week on average (not counting use with activity) * Cough interfering with sleep two time or less a month * Oral steroids no more than once a year * No hospitalizations  Urticaria,  chronic Angioedema Pruritus Adverse food reaction -Continue Pepcid '20mg'$  1 tab 1-2 a day for GI symptom control.   - Continue doxepin 2 tabs at bedtime for itch control for now.  Likely able to reduce this once on Xolair at stable dosing - Can continue as needed use of hydroxyzine for itch control for now - Will start Xolair monthly injections for control of hives/itching.  Benefits, risk and protocol discussed today.  Tammy, our biologics coordinator, will be in touch by phone regarding approval and starting.  - Food allergy testing was positive to onion.   Serum IgE levels was negative to red meats, beans, cinnamon and flounder - Continue your current food avoidance until advised otherwise - Have access to self-injectable epinephrine (Epipen or AuviQ) 0.'3mg'$  at all times - Follow emergency action plan in case of allergic reaction  Follow-up in 4-6 months or sooner if needed  I appreciate the opportunity to take part in Verdell's care. Please do not hesitate to contact me with questions.  Sincerely,   Prudy Feeler, MD Allergy/Immunology [facility]

## 2022-01-29 NOTE — Patient Instructions (Addendum)
-   Continue avoidance measures for grasses, trees, outdoor molds, and dust mites. - Continue with: Claritin (loratadine) '10mg'$  tablet once daily. Flonase (fluticasone) two sprays per nostril daily (AIM FOR EAR ON EACH SIDE) for 1-2 weeks at a time before stopping once nasal congestion improves for maximum benefit. Astelin (azelastine) 2 sprays per nostril 1-2 times daily as needed for runny nose.  Pataday (olopatadine) one drop per eye daily as needed for itchy/watery eyes.  - You can use an extra dose of the antihistamine, if needed, for breakthrough symptoms.  - Consider nasal saline rinses 1-2 times daily to remove allergens from the nasal cavities as well as help with mucous clearance (this is especially helpful to do before the nasal sprays are given) - Consider allergy shots as a means of long-term control if medication management is not effective enough.  Allergy shots "re-train" and "reset" the immune system to ignore environmental allergens and decrease the resulting immune response to those allergens (sneezing, itchy watery eyes, runny nose, nasal congestion, etc).  Allergy shots improve symptoms in 80-85% of patients.   - Daily controller medication(s): Symbicort 160/4.14mg two puffs twice daily with spacer - Prior to physical activity: albuterol 2 puffs 10-15 minutes before physical activity. - Rescue medications: albuterol 2 puffs every 4-6 hours as needed OR Symbicort 2 puffs (up to 6-8 additional puffs/day can be use as a rescue med)  - Asthma control goals:  * Full participation in all desired activities (may need albuterol before activity) * Albuterol use two time or less a week on average (not counting use with activity) * Cough interfering with sleep two time or less a month * Oral steroids no more than once a year * No hospitalizations  -Continue Pepcid '20mg'$  1 tab 1-2 a day for GI symptom control.   - Continue doxepin 2 tabs at bedtime for itch control for now.  Likely able to  reduce this once on Xolair at stable dosing - Can continue as needed use of hydroxyzine for itch control for now - Will start Xolair monthly injections for control of hives/itching.  Benefits, risk and protocol discussed today.  Tammy, our biologics coordinator, will be in touch by phone regarding approval and starting.  - Food allergy testing was positive to onion.   Serum IgE levels was negative to red meats, beans, cinnamon and flounder - Continue your current food avoidance until advised otherwise - Have access to self-injectable epinephrine (Epipen or AuviQ) 0.'3mg'$  at all times - Follow emergency action plan in case of allergic reaction  Follow-up in 4-6 months or sooner if needed

## 2022-01-31 ENCOUNTER — Telehealth: Payer: Self-pay | Admitting: *Deleted

## 2022-01-31 NOTE — Telephone Encounter (Signed)
Called patient and advised approval and submit for Xolair to [employer] and will reach out once delivery set to make appt to start therapy

## 2022-01-31 NOTE — Telephone Encounter (Signed)
-----   Message from [place], MD sent at 01/29/2022  4:54 PM EST ----- Starting Xolair for hives

## 2022-02-01 ENCOUNTER — Telehealth: Payer: Self-pay

## 2022-02-01 NOTE — Telephone Encounter (Signed)
[facility] called to verify delivery of Xolair on December 21st to the [place] location from the hours of 8:30am to 5:00pm closed 12:30pm -1:30pm for lunch.

## 2022-02-07 ENCOUNTER — Ambulatory Visit (INDEPENDENT_AMBULATORY_CARE_PROVIDER_SITE_OTHER): Payer: Medicare Other | Admitting: *Deleted

## 2022-02-07 DIAGNOSIS — L501 Idiopathic urticaria: Secondary | ICD-10-CM | POA: Diagnosis not present

## 2022-02-07 MED ORDER — OMALIZUMAB 150 MG/ML ~~LOC~~ SOSY
300.0000 mg | PREFILLED_SYRINGE | SUBCUTANEOUS | Status: AC
  Administered 2022-02-07 – 2022-07-03 (×6): 300 mg via SUBCUTANEOUS

## 2022-03-01 DIAGNOSIS — J069 Acute upper respiratory infection, unspecified: Secondary | ICD-10-CM | POA: Diagnosis not present

## 2022-03-01 DIAGNOSIS — Z20822 Contact with and (suspected) exposure to covid-19: Secondary | ICD-10-CM | POA: Diagnosis not present

## 2022-03-07 ENCOUNTER — Ambulatory Visit: Payer: Medicare Other

## 2022-03-11 ENCOUNTER — Ambulatory Visit (INDEPENDENT_AMBULATORY_CARE_PROVIDER_SITE_OTHER): Payer: Medicare Other | Admitting: *Deleted

## 2022-03-11 DIAGNOSIS — L501 Idiopathic urticaria: Secondary | ICD-10-CM

## 2022-03-12 ENCOUNTER — Ambulatory Visit: Payer: Medicare Other | Admitting: Gastroenterology

## 2022-04-02 DIAGNOSIS — E785 Hyperlipidemia, unspecified: Secondary | ICD-10-CM | POA: Diagnosis not present

## 2022-04-08 ENCOUNTER — Ambulatory Visit (INDEPENDENT_AMBULATORY_CARE_PROVIDER_SITE_OTHER): Payer: Medicare Other | Admitting: *Deleted

## 2022-04-08 DIAGNOSIS — L501 Idiopathic urticaria: Secondary | ICD-10-CM | POA: Diagnosis not present

## 2022-04-09 DIAGNOSIS — Z8659 Personal history of other mental and behavioral disorders: Secondary | ICD-10-CM | POA: Diagnosis not present

## 2022-04-09 DIAGNOSIS — F5104 Psychophysiologic insomnia: Secondary | ICD-10-CM | POA: Diagnosis not present

## 2022-04-09 DIAGNOSIS — I10 Essential (primary) hypertension: Secondary | ICD-10-CM | POA: Diagnosis not present

## 2022-04-09 DIAGNOSIS — E785 Hyperlipidemia, unspecified: Secondary | ICD-10-CM | POA: Diagnosis not present

## 2022-04-09 DIAGNOSIS — Z6825 Body mass index (BMI) 25.0-25.9, adult: Secondary | ICD-10-CM | POA: Diagnosis not present

## 2022-05-06 ENCOUNTER — Ambulatory Visit (INDEPENDENT_AMBULATORY_CARE_PROVIDER_SITE_OTHER): Payer: Medicare Other

## 2022-05-06 DIAGNOSIS — L501 Idiopathic urticaria: Secondary | ICD-10-CM | POA: Diagnosis not present

## 2022-05-06 DIAGNOSIS — J309 Allergic rhinitis, unspecified: Secondary | ICD-10-CM

## 2022-06-03 ENCOUNTER — Ambulatory Visit (INDEPENDENT_AMBULATORY_CARE_PROVIDER_SITE_OTHER): Payer: Medicare Other | Admitting: *Deleted

## 2022-06-03 DIAGNOSIS — L501 Idiopathic urticaria: Secondary | ICD-10-CM

## 2022-06-13 DIAGNOSIS — H5231 Anisometropia: Secondary | ICD-10-CM | POA: Diagnosis not present

## 2022-06-13 DIAGNOSIS — H524 Presbyopia: Secondary | ICD-10-CM | POA: Diagnosis not present

## 2022-06-13 DIAGNOSIS — H43812 Vitreous degeneration, left eye: Secondary | ICD-10-CM | POA: Diagnosis not present

## 2022-06-13 DIAGNOSIS — H52223 Regular astigmatism, bilateral: Secondary | ICD-10-CM | POA: Diagnosis not present

## 2022-06-18 ENCOUNTER — Other Ambulatory Visit: Payer: Self-pay | Admitting: Allergy

## 2022-06-24 DIAGNOSIS — M25511 Pain in right shoulder: Secondary | ICD-10-CM | POA: Diagnosis not present

## 2022-06-27 ENCOUNTER — Telehealth: Payer: Self-pay | Admitting: *Deleted

## 2022-06-27 NOTE — Telephone Encounter (Signed)
L/m for patient needs MD appt for XOlair reapproval 

## 2022-07-01 ENCOUNTER — Ambulatory Visit: Payer: Medicare Other

## 2022-07-01 ENCOUNTER — Telehealth: Payer: Self-pay | Admitting: *Deleted

## 2022-07-01 NOTE — Telephone Encounter (Signed)
Patient called and wants to start admin of Xolair at home. Will reach out to [employer] to advise same and phone # and insrtuctions for delivery storage and doing given to patient

## 2022-07-03 ENCOUNTER — Ambulatory Visit (INDEPENDENT_AMBULATORY_CARE_PROVIDER_SITE_OTHER): Payer: Medicare Other

## 2022-07-03 ENCOUNTER — Telehealth: Payer: Self-pay | Admitting: Allergy

## 2022-07-03 DIAGNOSIS — L501 Idiopathic urticaria: Secondary | ICD-10-CM

## 2022-07-03 NOTE — Telephone Encounter (Signed)
Patient states Generic and Brand Symbicort is no longer covered by her insurance. She is requesting an alternative sent in to [facility].

## 2022-07-10 NOTE — Telephone Encounter (Signed)
Looks like Advair HFA, Earlie Server, and Dulera are preferred alternatives. Please advise.

## 2022-07-11 MED ORDER — DULERA 200-5 MCG/ACT IN AERO: INHALATION_SPRAY | RESPIRATORY_TRACT | 1 refills | Status: DC

## 2022-07-11 NOTE — Telephone Encounter (Signed)
Called and informed patient of change in medication.  Sent Cletis Media to [facility].

## 2022-07-28 IMAGING — MG MM DIGITAL SCREENING BILAT W/ TOMO AND CAD
8 series · 8 of 24 positions shown · non-contrast
Comparison: Previous exam(s).

CLINICAL DATA: Screening.

EXAM:
DIGITAL SCREENING BILATERAL MAMMOGRAM WITH TOMOSYNTHESIS AND CAD
TECHNIQUE: Bilateral screening digital craniocaudal and mediolateral oblique
mammograms were obtained. Bilateral screening digital breast
tomosynthesis was performed. The images were evaluated with
computer-aided detection.

[R MLO synth-2D]
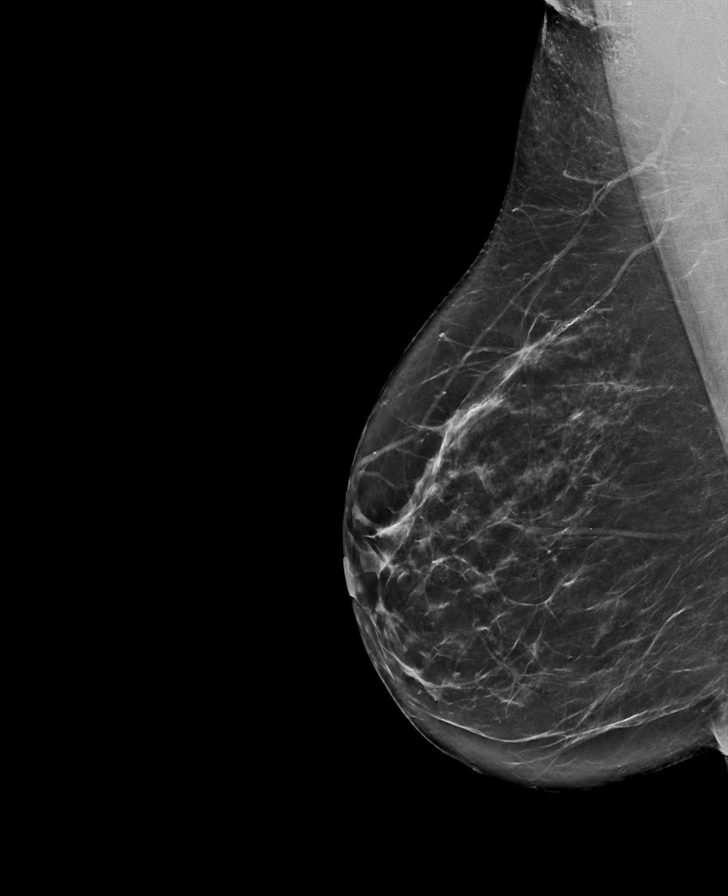

[R CC synth-2D]
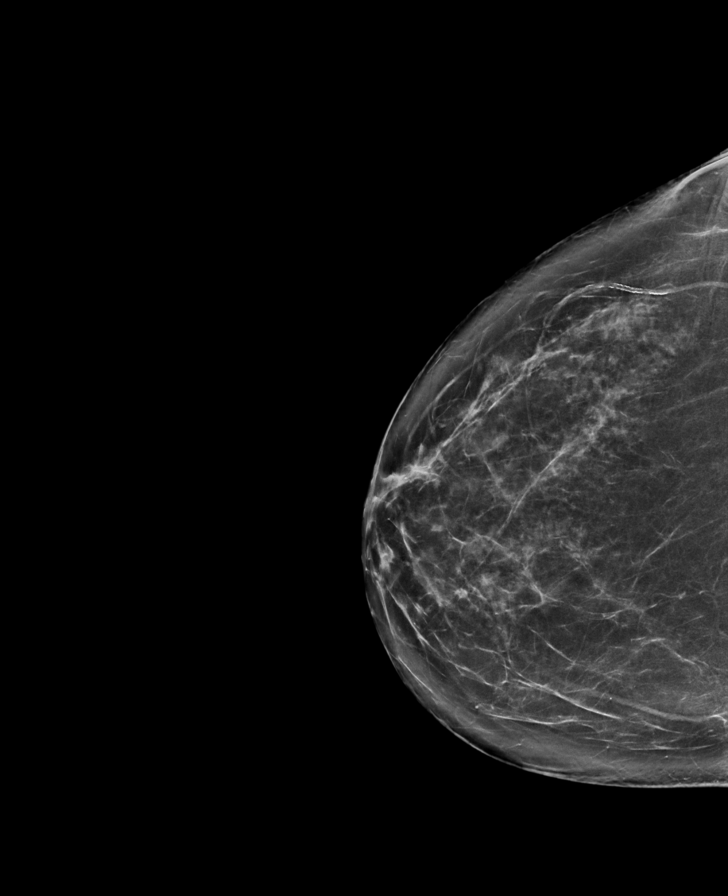

[L CC synth-2D]
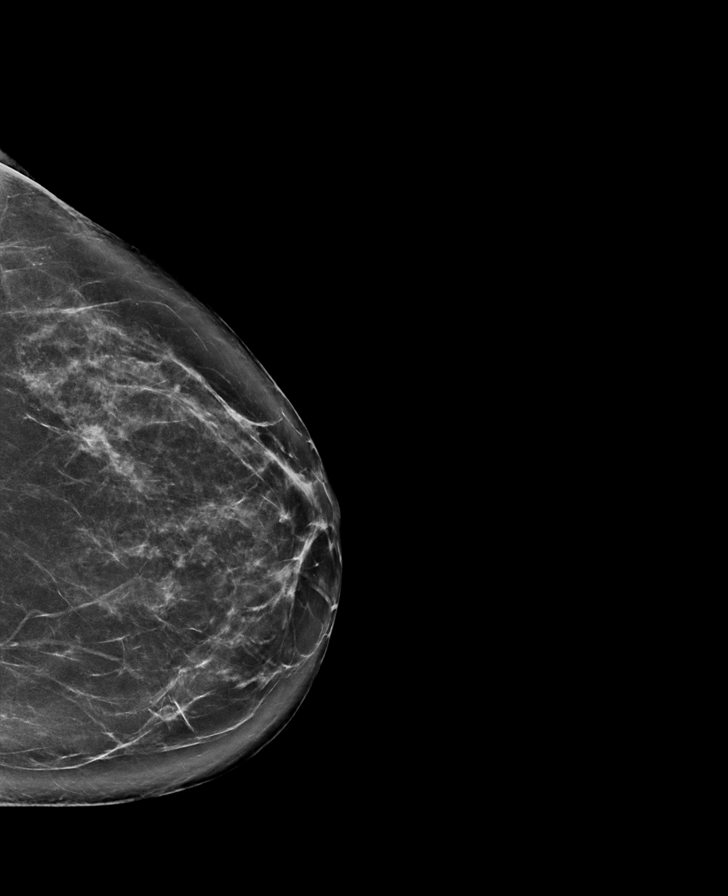

[L MLO synth-2D]
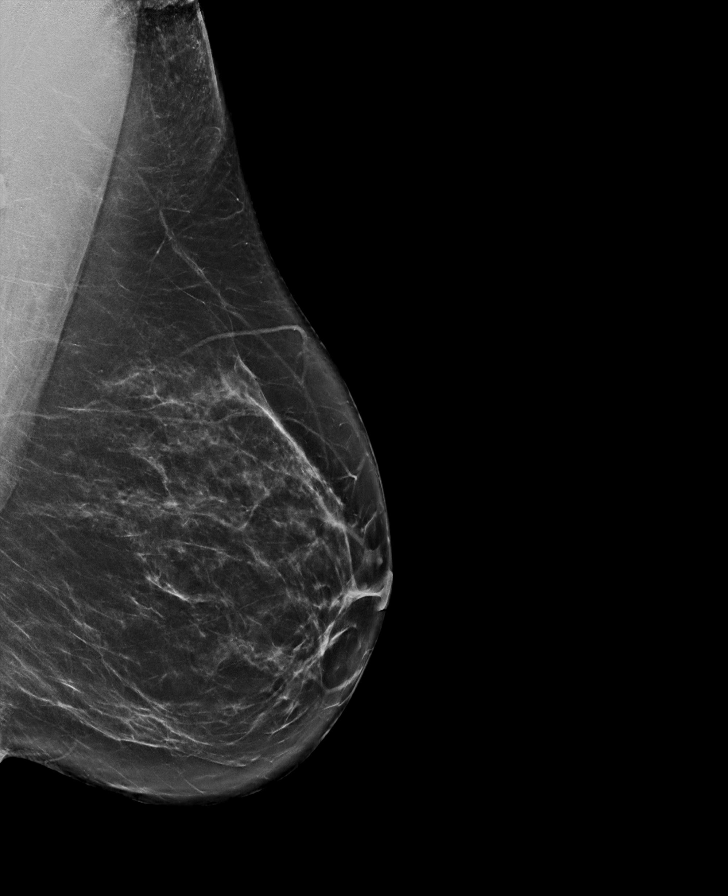

[R CC tomo · tomo slice 39/78.0]
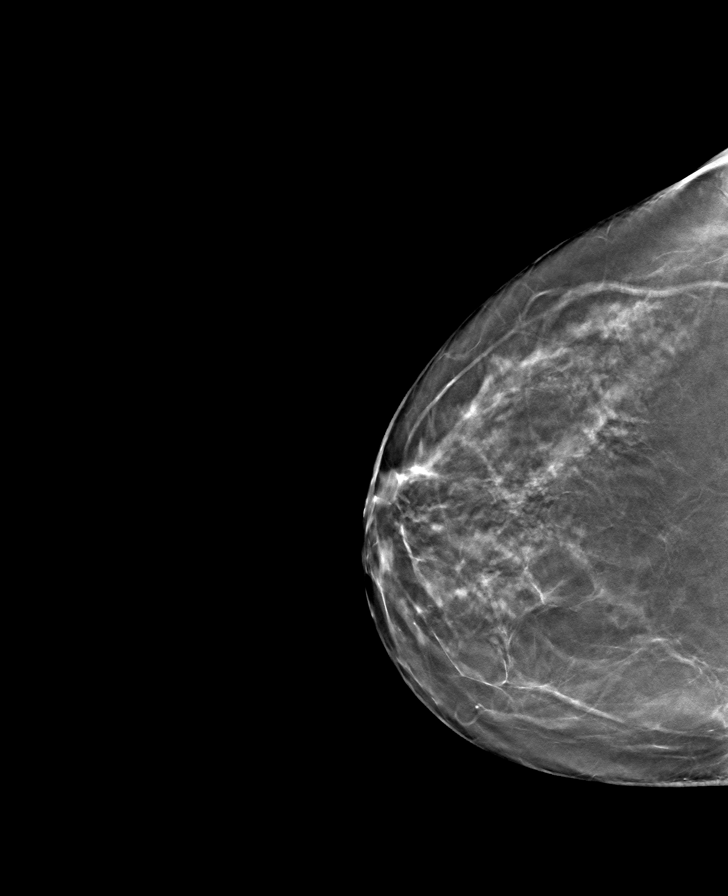

[L MLO tomo · tomo slice 41/81.0]
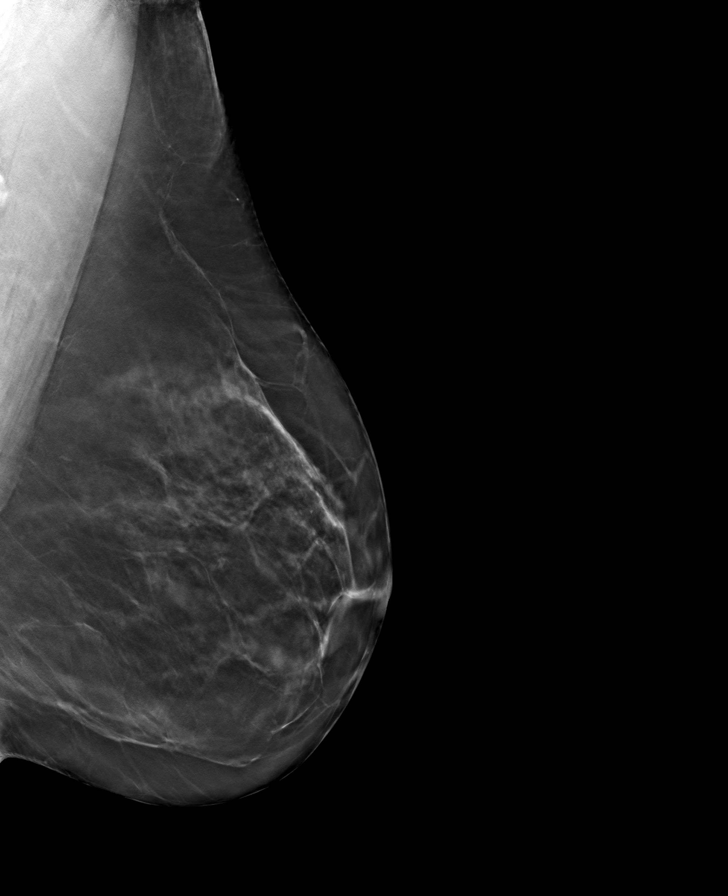

[R MLO tomo · tomo slice 39/77.0]
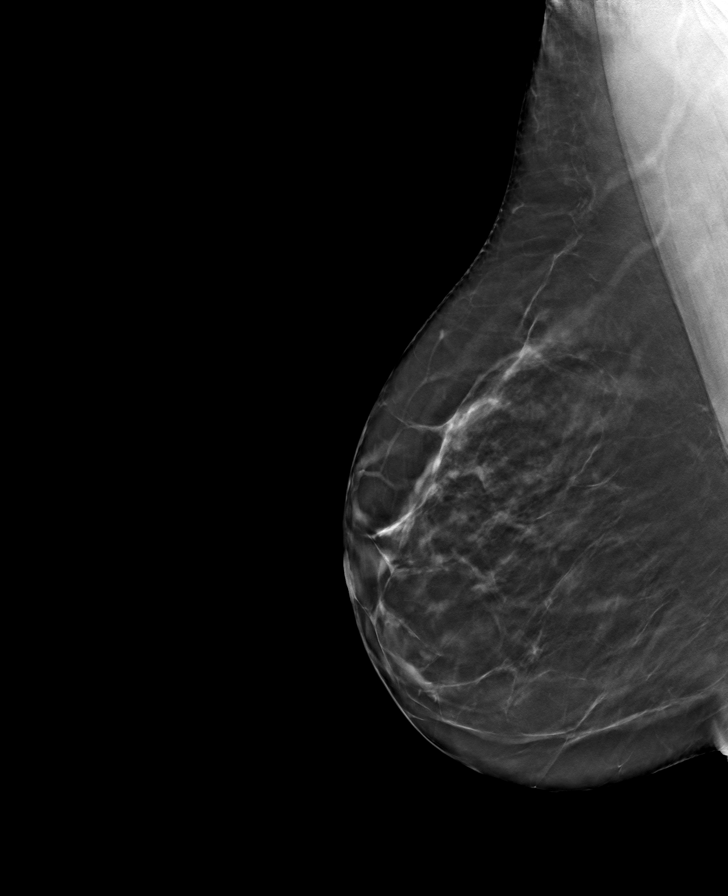

[L CC tomo · tomo slice 39/78.0]
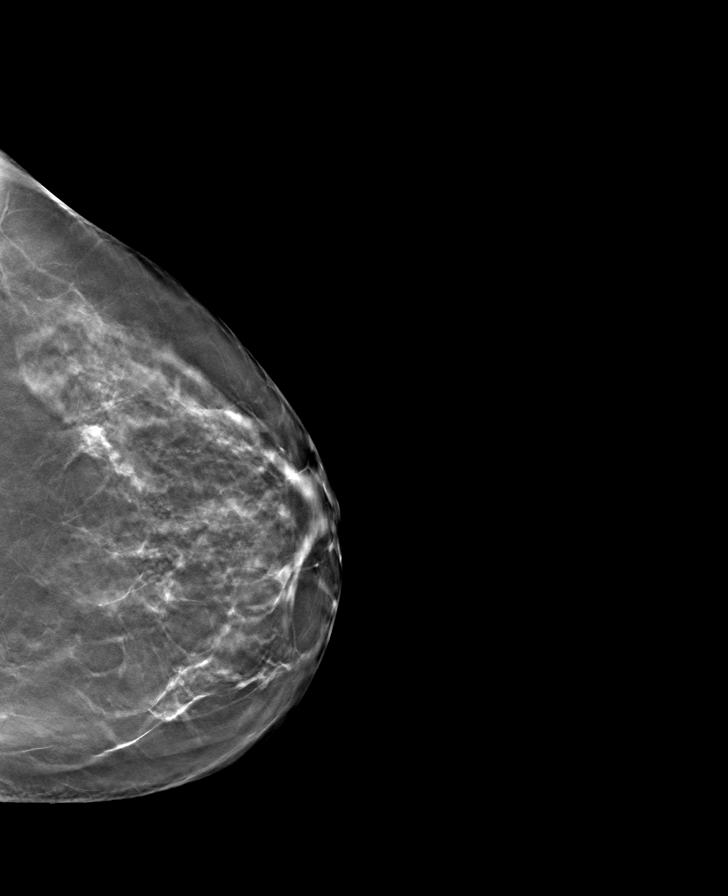

[8 of 24 positions shown; findings below may reference images not displayed]

ACR Breast Density Category b: There are scattered areas of
fibroglandular density.
FINDINGS: There are no findings suspicious for malignancy.
IMPRESSION: No mammographic evidence of malignancy. A result letter of this
screening mammogram will be mailed directly to the patient.

RECOMMENDATION:
Screening mammogram in one year. (Code:51-O-LD2)

BI-RADS CATEGORY  1: Negative.

## 2022-07-31 ENCOUNTER — Ambulatory Visit: Payer: Medicare Other

## 2022-08-02 DIAGNOSIS — E785 Hyperlipidemia, unspecified: Secondary | ICD-10-CM | POA: Diagnosis not present

## 2022-08-26 DIAGNOSIS — J029 Acute pharyngitis, unspecified: Secondary | ICD-10-CM | POA: Diagnosis not present

## 2022-08-26 DIAGNOSIS — Z20822 Contact with and (suspected) exposure to covid-19: Secondary | ICD-10-CM | POA: Diagnosis not present

## 2022-08-26 DIAGNOSIS — Z6825 Body mass index (BMI) 25.0-25.9, adult: Secondary | ICD-10-CM | POA: Diagnosis not present

## 2022-08-26 DIAGNOSIS — J45909 Unspecified asthma, uncomplicated: Secondary | ICD-10-CM | POA: Diagnosis not present

## 2022-09-23 ENCOUNTER — Encounter: Payer: Self-pay | Admitting: Allergy and Immunology

## 2022-09-23 ENCOUNTER — Ambulatory Visit: Payer: Medicare Other | Admitting: Allergy and Immunology

## 2022-09-23 VITALS — BP 122/84 | HR 64 | Temp 98.0°F

## 2022-09-23 DIAGNOSIS — J4540 Moderate persistent asthma, uncomplicated: Secondary | ICD-10-CM

## 2022-09-23 DIAGNOSIS — L501 Idiopathic urticaria: Secondary | ICD-10-CM | POA: Diagnosis not present

## 2022-09-23 DIAGNOSIS — J3089 Other allergic rhinitis: Secondary | ICD-10-CM

## 2022-09-23 DIAGNOSIS — L719 Rosacea, unspecified: Secondary | ICD-10-CM

## 2022-09-23 MED ORDER — ALBUTEROL SULFATE HFA 108 (90 BASE) MCG/ACT IN AERS: INHALATION_SPRAY | RESPIRATORY_TRACT | 1 refills | Status: DC

## 2022-09-23 MED ORDER — METRONIDAZOLE 0.75 % EX CREA: TOPICAL_CREAM | Freq: Two times a day (BID) | CUTANEOUS | 2 refills | Status: AC

## 2022-09-23 MED ORDER — EPINEPHRINE 0.3 MG/0.3ML IJ SOAJ: INTRAMUSCULAR | 1 refills | Status: AC

## 2022-09-23 MED ORDER — DULERA 200-5 MCG/ACT IN AERO: INHALATION_SPRAY | RESPIRATORY_TRACT | 5 refills | Status: DC

## 2022-09-23 NOTE — Patient Instructions (Addendum)
  1. Continue Xolair injections (and EpiPen)  2. Continue to treat and prevent inflammation of airway:   A. Dulera 200 - 2 inhalations 1-2 times per day  B. Flonase - 1 spray each nostril 1-2 times per day  3. If needed:   A. Claritin 10 mg - 1 tablet 1 time per day  B. Pataday - 1 drop each eye 1 time per day  C. Albuterol - 2 inhalations every 4-6 hours  4. Treat rosacea with Metrocream - apply 1-2 times per day  5. Return to clinic in 6 months or earlier if problem  6. Plan for fall flu vaccine

## 2022-09-23 NOTE — Progress Notes (Signed)
[place] - [place] - [place] - [place] - [place]   Follow-up Note  Referring Provider: Abner Greenspan, MD Primary Provider: Abner Greenspan, MD Date of Office Visit: 09/23/2022  Subjective:   Tara Schmidt (DOB: April 02, 1980) is a 42 y.o. female who [facility] in re-evaluation of the following:  HPI: Tara Schmidt presents to the clinic in evaluation of urticaria, allergic rhinoconjunctivitis, asthma.  I have never seen her in this clinic and her last visit with Dr. Delorse Lek was 29 January 2022.  She started on the Lizza Mab injections about 6 months ago and she has had an excellent response regarding her chronic urticaria.  She has been able to taper off doxepin and famotidine.  She does not have any urticarial lesions as long as she is on her omalizumab.  Her airway issue is under very good control while she uses Dulera and Flonase on a consistent basis.  She does have Claritin and Pataday and albuterol if needed.  Rarely does she use any albuterol.  She relates a history of getting lots of blushing and flushing and burning of her face on a pretty common basis.  If she gets hot her face lights up the redness.  Allergies as of 09/23/2022       Reactions   Other Hives, Other (See Comments)   Seafood - throat closes up Seasonal allergies, dogs, cats Corn, tomoato   Penicillins Swelling   Throat, tongue, rash    Sertraline Rash, Swelling   Tongue, throat and rash    Sertraline Hcl Hives, Rash   Effexor [venlafaxine] Hives   Latex Hives   Septra [sulfamethoxazole-trimethoprim] Other (See Comments)   GI upset   Adhesive [tape] Hives, Rash   Azithromycin Rash   Citalopram Rash        Medication List        Accurate as of September 23, 2022  3:50 PM. If you have any questions, ask your nurse or doctor.          albuterol 108 (90 Base) MCG/ACT inhaler Commonly known as: Proventil HFA Can inhale two puffs every four to six hours as  needed for cough or wheeze.   azelastine 0.1 % nasal spray Commonly known as: ASTELIN Use two sprays in each nostril twice daily if needed   doxepin 25 MG capsule Commonly known as: SINEQUAN   Dulera 200-5 MCG/ACT Aero Generic drug: mometasone-formoterol Inhale two puffs twice daily to prevent cough or wheeze.  Rinse, gargle, and spit after use.   EPINEPHrine 0.3 mg/0.3 mL Soaj injection Commonly known as: EPI-PEN Use as directed for life-threatening allergic reaction.   famotidine 20 MG tablet Commonly known as: PEPCID Take one tablet twice daily as directed.   fluticasone 50 MCG/ACT nasal spray Commonly known as: FLONASE Use two sprays in each nostril once daily as directed   linaclotide 72 MCG capsule Commonly known as: Linzess TAKE ONE CAPSULE BY MOUTH DAILY before breakfast   loratadine 10 MG tablet Commonly known as: CLARITIN Take 1 tablet by mouth daily.   pantoprazole 40 MG tablet Commonly known as: PROTONIX TAKE ONE TABLET BY MOUTH DAILY   simvastatin 20 MG tablet Commonly known as: ZOCOR Take 20 mg by mouth at bedtime. What changed: Another medication with the same name was removed. Continue taking this medication, and follow the directions you see here. Changed by:  Claudia Pollock        Past Medical History:  Diagnosis Date  Abnormal tympanic membrane of both ears    Acid reflux    Anxiety    Asthma    Depression    History of TMJ disorder    Hypertension    Hypoglycemia    Sacroiliitis (HCC)    Suicide attempt (HCC)    x 3   Syncope and collapse    Urticaria     Past Surgical History:  Procedure Laterality Date   ABDOMINAL HYSTERECTOMY     partical hysterectomy and right ovary. Currently have left ovary   WISDOM TOOTH EXTRACTION     all 3    Review of systems negative except as noted in HPI / PMHx or noted below:  Review of Systems  Constitutional: Negative.   HENT: Negative.    Eyes: Negative.   Respiratory: Negative.     Cardiovascular: Negative.   Gastrointestinal: Negative.   Genitourinary: Negative.   Musculoskeletal: Negative.   Skin: Negative.   Neurological: Negative.   Endo/Heme/Allergies: Negative.   Psychiatric/Behavioral: Negative.       Objective:   Vitals:   09/23/22 1541  BP: 122/84  Pulse: 64  Temp: 98 F (36.7 C)  SpO2: 98%          Physical Exam Constitutional:      Appearance: She is not diaphoretic.  HENT:     Head: Normocephalic.     Right Ear: Tympanic membrane, ear canal and external ear normal.     Left Ear: Tympanic membrane, ear canal and external ear normal.     Nose: Nose normal. No mucosal edema or rhinorrhea.     Mouth/Throat:     Pharynx: Uvula midline. No oropharyngeal exudate.  Eyes:     Conjunctiva/sclera: Conjunctivae normal.  Neck:     Thyroid: No thyromegaly.     Trachea: Trachea normal. No tracheal tenderness or tracheal deviation.  Cardiovascular:     Rate and Rhythm: Normal rate and regular rhythm.     Heart sounds: Normal heart sounds, S1 normal and S2 normal. No murmur heard. Pulmonary:     Effort: No respiratory distress.     Breath sounds: Normal breath sounds. No stridor. No wheezing or rales.  Lymphadenopathy:     Head:     Right side of head: No tonsillar adenopathy.     Left side of head: No tonsillar adenopathy.     Cervical: No cervical adenopathy.  Skin:    Findings: Rash (facial erythema malar region) present. No erythema.     Nails: There is no clubbing.  Neurological:     Mental Status: She is alert.     Diagnostics:    Spirometry was performed and demonstrated an FEV1 of 2.66 at 102 % of predicted.  Assessment and Plan:   1. Idiopathic urticaria   2. Perennial allergic rhinitis   3. Asthma, moderate persistent, well-controlled   4. Rosacea      1. Continue Xolair injections (and EpiPen)  2. Continue to treat and prevent inflammation of airway:   A. Dulera 200 - 2 inhalations 1-2 times per day  B. Flonase  - 1 spray each nostril 1-2 times per day  3. If needed:   A. Claritin 10 mg - 1 tablet 1 time per day  B. Pataday - 1 drop each eye 1 time per day  C. Albuterol - 2 inhalations every 4-6 hours  4. Treat rosacea with Metrocream - apply 1-2 times per day  5. Return to clinic in 6 months or earlier if problem  6. Plan for fall flu vaccine  Tara Schmidt has had an excellent response to the administration of omalizumab and she will remain on this biologic agent to control her chronic urticaria.  Will give her another 6 months and then reassess her situation.  Her airway issue also appears to be under very good control on her current plan of using Dulera and Flonase and she can decide the dosing of Dulera and Flonase depending on disease activity.  She has rosacea and we will treat her with MetroCream.  She may require doxycycline if MetroCream does not work.  Tara Schimke, MD Allergy / Immunology [facility]

## 2022-09-24 ENCOUNTER — Encounter: Payer: Self-pay | Admitting: Allergy and Immunology

## 2022-09-24 ENCOUNTER — Other Ambulatory Visit: Payer: Self-pay | Admitting: *Deleted

## 2022-09-24 MED ORDER — OMALIZUMAB 300 MG/2  ML ~~LOC~~ SOSY: 300.0000 mg | PREFILLED_SYRINGE | SUBCUTANEOUS | 11 refills | Status: DC

## 2022-09-29 ENCOUNTER — Other Ambulatory Visit: Payer: Self-pay | Admitting: Allergy and Immunology

## 2022-11-15 ENCOUNTER — Other Ambulatory Visit: Payer: Self-pay | Admitting: Gastroenterology

## 2022-11-21 ENCOUNTER — Other Ambulatory Visit: Payer: Self-pay

## 2022-11-21 MED ORDER — LORATADINE 10 MG PO TABS
ORAL_TABLET | ORAL | 4 refills | Status: DC
Start: 2022-11-21 — End: 2023-07-25

## 2022-12-24 DIAGNOSIS — Z1331 Encounter for screening for depression: Secondary | ICD-10-CM | POA: Diagnosis not present

## 2022-12-24 DIAGNOSIS — Z1389 Encounter for screening for other disorder: Secondary | ICD-10-CM | POA: Diagnosis not present

## 2022-12-24 DIAGNOSIS — Z136 Encounter for screening for cardiovascular disorders: Secondary | ICD-10-CM | POA: Diagnosis not present

## 2022-12-24 DIAGNOSIS — Z0000 Encounter for general adult medical examination without abnormal findings: Secondary | ICD-10-CM | POA: Diagnosis not present

## 2022-12-24 DIAGNOSIS — Z1339 Encounter for screening examination for other mental health and behavioral disorders: Secondary | ICD-10-CM | POA: Diagnosis not present

## 2022-12-24 DIAGNOSIS — Z139 Encounter for screening, unspecified: Secondary | ICD-10-CM | POA: Diagnosis not present

## 2023-01-07 DIAGNOSIS — Z6821 Body mass index (BMI) 21.0-21.9, adult: Secondary | ICD-10-CM | POA: Diagnosis not present

## 2023-01-07 DIAGNOSIS — J45909 Unspecified asthma, uncomplicated: Secondary | ICD-10-CM | POA: Diagnosis not present

## 2023-01-17 DIAGNOSIS — R300 Dysuria: Secondary | ICD-10-CM | POA: Diagnosis not present

## 2023-01-17 DIAGNOSIS — R399 Unspecified symptoms and signs involving the genitourinary system: Secondary | ICD-10-CM | POA: Diagnosis not present

## 2023-01-20 ENCOUNTER — Other Ambulatory Visit: Payer: Self-pay

## 2023-01-20 MED ORDER — LINACLOTIDE 72 MCG PO CAPS: ORAL_CAPSULE | ORAL | 0 refills | Status: DC

## 2023-01-27 ENCOUNTER — Other Ambulatory Visit: Payer: Self-pay | Admitting: Family Medicine

## 2023-01-27 DIAGNOSIS — Z1231 Encounter for screening mammogram for malignant neoplasm of breast: Secondary | ICD-10-CM

## 2023-04-18 ENCOUNTER — Other Ambulatory Visit: Payer: Self-pay | Admitting: Gastroenterology

## 2023-05-11 ENCOUNTER — Other Ambulatory Visit: Payer: Self-pay | Admitting: Allergy and Immunology

## 2023-06-15 ENCOUNTER — Other Ambulatory Visit: Payer: Self-pay | Admitting: Allergy and Immunology

## 2023-06-28 DIAGNOSIS — R197 Diarrhea, unspecified: Secondary | ICD-10-CM | POA: Diagnosis not present

## 2023-06-28 DIAGNOSIS — R140 Abdominal distension (gaseous): Secondary | ICD-10-CM | POA: Diagnosis not present

## 2023-06-28 DIAGNOSIS — I10 Essential (primary) hypertension: Secondary | ICD-10-CM | POA: Diagnosis not present

## 2023-06-28 DIAGNOSIS — R112 Nausea with vomiting, unspecified: Secondary | ICD-10-CM | POA: Diagnosis not present

## 2023-06-28 DIAGNOSIS — R110 Nausea: Secondary | ICD-10-CM | POA: Diagnosis not present

## 2023-06-28 DIAGNOSIS — R10817 Generalized abdominal tenderness: Secondary | ICD-10-CM | POA: Diagnosis not present

## 2023-06-28 DIAGNOSIS — R1031 Right lower quadrant pain: Secondary | ICD-10-CM | POA: Diagnosis not present

## 2023-06-28 DIAGNOSIS — R109 Unspecified abdominal pain: Secondary | ICD-10-CM | POA: Diagnosis not present

## 2023-06-28 DIAGNOSIS — R1084 Generalized abdominal pain: Secondary | ICD-10-CM | POA: Diagnosis not present

## 2023-06-28 DIAGNOSIS — R030 Elevated blood-pressure reading, without diagnosis of hypertension: Secondary | ICD-10-CM | POA: Diagnosis not present

## 2023-06-28 DIAGNOSIS — Z90710 Acquired absence of both cervix and uterus: Secondary | ICD-10-CM | POA: Diagnosis not present

## 2023-07-08 ENCOUNTER — Telehealth: Payer: Self-pay | Admitting: Gastroenterology

## 2023-07-08 MED ORDER — PANTOPRAZOLE SODIUM 40 MG PO TBEC: 40.0000 mg | DELAYED_RELEASE_TABLET | Freq: Every day | ORAL | 0 refills | Status: DC

## 2023-07-08 MED ORDER — LINACLOTIDE 72 MCG PO CAPS: 72.0000 ug | ORAL_CAPSULE | Freq: Every day | ORAL | 0 refills | Status: DC

## 2023-07-08 NOTE — Telephone Encounter (Signed)
 Sent!

## 2023-07-08 NOTE — Telephone Encounter (Signed)
 Patient requesting a refill for Linzess  and Pantoprazole  [facility] [phone] [facility]. Patient has been scheduled for 6/16. Please advise, thank you.

## 2023-07-24 ENCOUNTER — Other Ambulatory Visit: Payer: Self-pay | Admitting: Allergy and Immunology

## 2023-07-24 ENCOUNTER — Other Ambulatory Visit: Payer: Self-pay | Admitting: Allergy

## 2023-07-30 ENCOUNTER — Telehealth: Admitting: Physician Assistant

## 2023-07-30 DIAGNOSIS — H00014 Hordeolum externum left upper eyelid: Secondary | ICD-10-CM | POA: Diagnosis not present

## 2023-07-30 MED ORDER — POLYMYXIN B-TRIMETHOPRIM 10000-0.1 UNIT/ML-% OP SOLN: OPHTHALMIC | 0 refills | Status: AC

## 2023-07-30 NOTE — Progress Notes (Signed)
 I have spent 5 minutes in review of e-visit questionnaire, review and updating patient chart, medical decision making and response to patient.   Piedad Climes, PA-C

## 2023-07-30 NOTE — Progress Notes (Signed)
  E-Visit for Stye   We are sorry that you are not feeling well. Here is how we plan to help!  Based on what you have shared with me it looks like you have a stye.  A stye is an inflammation of the eyelid.  It is often a red, painful lump near the edge of the eyelid that may look like a boil or a pimple.  A stye develops when an infection occurs at the base of an eyelash.   We have made appropriate suggestions for you based upon your presentation: Simple styes can be treated without medical intervention.  Most styes either resolve spontaneously or resolve with simple home treatment by applying warm compresses or heated washcloth to the stye for about 10-15 minutes three to four times a day. This causes the stye to drain and resolve. Giving substantial tenderness and concern for secondary infection, I have added on an antibiotic eye drop to use as directed.  HOME CARE:  Wash your hands often! Let the stye open on its own. Don't squeeze or open it. Don't rub your eyes. This can irritate your eyes and let in bacteria.  If you need to touch your eyes, wash your hands first. Don't wear eye makeup or contact lenses until the area has healed.  GET HELP RIGHT AWAY IF:  Your symptoms do not improve. You develop blurred or loss of vision. Your symptoms worsen (increased discharge, pain or redness).   Thank you for choosing an e-visit.  Your e-visit answers were reviewed by a board certified advanced clinical practitioner to complete your personal care plan. Depending upon the condition, your plan could have included both over the counter or prescription medications.  Please review your pharmacy choice. Make sure the pharmacy is open so you can pick up prescription now. If there is a problem, you may contact your provider through [employer] and have the prescription routed to another pharmacy.  Your safety is important to us . If you have drug allergies check your prescription carefully.    For the next 24 hours you can use MyChart to ask questions about today's visit, request a non-urgent call back, or ask for a work or school excuse. You will get an email in the next two days asking about your experience. I hope that your e-visit has been valuable and will speed your recovery.

## 2023-08-04 ENCOUNTER — Other Ambulatory Visit (INDEPENDENT_AMBULATORY_CARE_PROVIDER_SITE_OTHER)

## 2023-08-04 ENCOUNTER — Encounter: Payer: Self-pay | Admitting: Physician Assistant

## 2023-08-04 ENCOUNTER — Ambulatory Visit (INDEPENDENT_AMBULATORY_CARE_PROVIDER_SITE_OTHER)
Admission: RE | Admit: 2023-08-04 | Discharge: 2023-08-04 | Disposition: A | Discharge status: Home / Self Care | Source: Ambulatory Visit | Attending: Physician Assistant | Admitting: Physician Assistant

## 2023-08-04 ENCOUNTER — Ambulatory Visit (INDEPENDENT_AMBULATORY_CARE_PROVIDER_SITE_OTHER): Admitting: Physician Assistant

## 2023-08-04 VITALS — BP 110/62 | HR 84 | Ht 61.0 in | Wt 129.0 lb

## 2023-08-04 DIAGNOSIS — K219 Gastro-esophageal reflux disease without esophagitis: Secondary | ICD-10-CM

## 2023-08-04 DIAGNOSIS — R1319 Other dysphagia: Secondary | ICD-10-CM

## 2023-08-04 DIAGNOSIS — R140 Abdominal distension (gaseous): Secondary | ICD-10-CM

## 2023-08-04 DIAGNOSIS — R110 Nausea: Secondary | ICD-10-CM

## 2023-08-04 DIAGNOSIS — K581 Irritable bowel syndrome with constipation: Secondary | ICD-10-CM

## 2023-08-04 DIAGNOSIS — R1310 Dysphagia, unspecified: Secondary | ICD-10-CM

## 2023-08-04 DIAGNOSIS — K580 Irritable bowel syndrome with diarrhea: Secondary | ICD-10-CM

## 2023-08-04 DIAGNOSIS — R109 Unspecified abdominal pain: Secondary | ICD-10-CM | POA: Diagnosis not present

## 2023-08-04 DIAGNOSIS — I878 Other specified disorders of veins: Secondary | ICD-10-CM | POA: Diagnosis not present

## 2023-08-04 DIAGNOSIS — R197 Diarrhea, unspecified: Secondary | ICD-10-CM | POA: Diagnosis not present

## 2023-08-04 LAB — COMPREHENSIVE METABOLIC PANEL WITH GFR
ALT: 14 U/L (ref 0–35)
AST: 16 U/L (ref 0–37)
Albumin: 4.7 g/dL (ref 3.5–5.2)
Alkaline Phosphatase: 72 U/L (ref 39–117)
BUN: 12 mg/dL (ref 6–23)
CO2: 31 meq/L (ref 19–32)
Calcium: 10 mg/dL (ref 8.4–10.5)
Chloride: 101 meq/L (ref 96–112)
Creatinine, Ser: 0.78 mg/dL (ref 0.40–1.20)
GFR: 91.76 mL/min (ref >60.00)
Glucose, Bld: 80 mg/dL (ref 70–99)
Potassium: 4 meq/L (ref 3.5–5.1)
Sodium: 139 meq/L (ref 135–145)
Total Bilirubin: 0.5 mg/dL (ref 0.2–1.2)
Total Protein: 7.7 g/dL (ref 6.0–8.3)

## 2023-08-04 LAB — CBC WITH DIFFERENTIAL/PLATELET
Basophils Absolute: 0.1 10*3/uL (ref 0.0–0.1)
Basophils Relative: 0.5 % (ref 0.0–3.0)
Eosinophils Absolute: 0.1 10*3/uL (ref 0.0–0.7)
Eosinophils Relative: 0.7 % (ref 0.0–5.0)
HCT: 39 % (ref 36.0–46.0)
Hemoglobin: 13.3 g/dL (ref 12.0–15.0)
Lymphocytes Relative: 24.4 % (ref 12.0–46.0)
Lymphs Abs: 2.6 10*3/uL (ref 0.7–4.0)
MCHC: 34 g/dL (ref 30.0–36.0)
MCV: 86 fl (ref 78.0–100.0)
Monocytes Absolute: 0.9 10*3/uL (ref 0.1–1.0)
Monocytes Relative: 8.3 % (ref 3.0–12.0)
Neutro Abs: 7.1 10*3/uL (ref 1.4–7.7)
Neutrophils Relative %: 66.1 % (ref 43.0–77.0)
Platelets: 358 10*3/uL (ref 150.0–400.0)
RBC: 4.54 Mil/uL (ref 3.87–5.11)
RDW: 12.2 % (ref 11.5–15.5)
WBC: 10.7 10*3/uL — ABNORMAL HIGH (ref 4.0–10.5)

## 2023-08-04 LAB — SEDIMENTATION RATE: Sed Rate: 6 mm/h (ref 0–20)

## 2023-08-04 LAB — TSH: TSH: 6.09 u[IU]/mL — ABNORMAL HIGH (ref 0.35–5.50)

## 2023-08-04 LAB — C-REACTIVE PROTEIN: CRP: <1 mg/dL (ref 0.5–20.0)

## 2023-08-04 LAB — LIPASE: Lipase: 37 U/L (ref 11.0–59.0)

## 2023-08-04 MED ORDER — ONDANSETRON HCL 8 MG PO TABS: 8.0000 mg | ORAL_TABLET | Freq: Three times a day (TID) | ORAL | 2 refills | Status: AC | PRN

## 2023-08-04 MED ORDER — BENEFIBER PO POWD: ORAL | 0 refills | Status: AC

## 2023-08-04 NOTE — Progress Notes (Unsigned)
 08/04/2023 Tara Schmidt [id number] [birth year]  Referring provider: Lonie Roa, MD Primary GI doctor: Dr. Venice Gillis  ASSESSMENT AND PLAN:  IBS-C with abdominal bloating 07/06/2018 colonoscopy for lower abdominal discomfort and bloating showed nonbleeding internal hemorrhoids pancolonic diverticulosis normal TI recall 10 years 12/2020 at [facility] CT abdomen pelvis no acute findings 03/2021 negative celiac and CRP 06/2023 CT AB and pelvis with contrast normal  Started on Linzess  72 mcg has a BM small volume soft liquid stools 2-3 x a day, if she skips a day she has constipation, bloating and discomfort is better if she has a complete BM Had weight loss but gained the weight back, no hematochezia, has right lower AB pain Multiple normal CT, normal colon, likely IBS-C and or pelvic floor - Order abdominal x-ray to assess for fecal loading. - Consider pelvic floor physical therapy, information given - Prescribe Benefiber. - Recheck inflammatory markers. - Consider repeat colonoscopy if inflammatory markers are elevated.  GERD with reflux despite protonix  and pepcid  addition with dysphagia, with nausea every morning, promethazine  helps, no vomiting 07/06/2018 EGD with Dr. Venice Gillis with mildly torturous esophagus status post empiric dilation, mild gastritis, negative celiac, negative EOE, negative H. pylori gastritis. Normal gastric emptying study 03/2021 - increase pantoprazole  20 mg BID for 3 months, provide zofran  -Lifestyle changes discussed, avoid NSAIDS, ETOH, hand out given to the patient -Schedule EGD with dilation at [facility] to evaluate GERd, esophagitis, hiatal hernia,I discussed risks of EGD with patient today, including risk of sedation, bleeding or perforation. Patient provides understanding and gave verbal consent to proceed. - will get US  and HIDA pending results -Instructed to quit smoking marijuana, can take up to 9-12 months of cessation. Handout given to the patient   Patient Care  Team: Lonie Roa, MD as PCP - General (Family Medicine)  HISTORY OF PRESENT ILLNESS: 43 y.o. female with a past medical history listed below presents for evaluation of medication refills.   Last seen by Dr. Venice Gillis 04/02/2021 for generalized abdominal pain and bloating.  Started on Protonix  40 mg daily, Linzess  72 mcg daily and MiraLAX as needed. Normal gastric emptying study 03/2021 negative celiac and CRP  Discussed the use of AI scribe software for clinical note transcription with the patient, who gave verbal consent to proceed.  History of Present Illness   Tara Schmidt is a 43 year old female who presents with persistent nausea and gastrointestinal symptoms.  She experiences persistent nausea, described as similar to 'morning sickness,' occurring every morning and throughout the day, with no possibility of pregnancy. She experiences dry heaving sensations without actual vomiting. Promethazine  provides some relief but is not long-lasting. She uses a vape daily, which she started for nausea related to kidney stones.  She has a history of abdominal discomfort and bloating, with episodes of her stomach becoming distended, making her appear 'eight, nine months pregnant.' She notes a difference between her usual bloating and these episodes. She reports pain around the umbilical region, which worsens before and after bowel movements.  Her bowel movements are irregular without Linzess , which she takes at 72 micrograms daily. With Linzess , she experiences frequent, watery bowel movements throughout the day, sometimes feeling incomplete. She describes the sensation of 'Orlinda Blackbird' coming out of her stomach before starting Linzess . She has experienced significant weight fluctuations, dropping to 114 pounds and then regaining to 129 pounds.  She has a history of colitis diagnosed in November 2022 and gastroenteritis in May 2025. A CT scan in May 2025 showed no  acute findings, and a gastric emptying  study in 2023 was normal. She has had a colonoscopy and EGD in 2020, both normal. No blood in stool.  She experiences heartburn and reflux despite taking Protonix  and Pepcid  daily. She has difficulty swallowing, with food sometimes getting caught, and has had her esophagus stretched previously. She recalls being told she has esophageal dysphagia.  Family history includes gallbladder issues in her grandmother and stomach cancer in her paternal grandmother and aunt. She socially drinks alcohol  and uses a vape daily for nausea relief. She has a history of kidney stones.        She  reports that she has never smoked. She has never used smokeless tobacco. She reports that she does not drink alcohol  and does not use drugs.  RELEVANT GI HISTORY, IMAGING AND LABS: Results   LABS Celiac panel: Negative (2023)  RADIOLOGY CT abdomen: No significant findings (06/28/2023) CT abdomen: No acute findings (12/2020)  DIAGNOSTIC Gastric emptying study: Normal (2023) Colonoscopy: Normal (2020) Esophagogastroduodenoscopy (EGD): Normal (2020)      CBC    Component Value Date/Time   WBC 8.5 04/03/2021 1532   RBC 4.26 04/03/2021 1532   HGB 12.5 04/03/2021 1532   HCT 37.3 04/03/2021 1532   PLT 312.0 04/03/2021 1532   MCV 87.6 04/03/2021 1532   MCH 29.6 06/11/2015 2027   MCHC 33.6 04/03/2021 1532   RDW 12.1 04/03/2021 1532   No results for input(s): HGB in the last 8760 hours.  CMP     Component Value Date/Time   NA 137 04/03/2021 1532   K 3.9 04/03/2021 1532   CL 102 04/03/2021 1532   CO2 25 04/03/2021 1532   GLUCOSE 80 04/03/2021 1532   BUN 11 04/03/2021 1532   CREATININE 0.81 04/03/2021 1532   CALCIUM 9.2 04/03/2021 1532   PROT 7.8 04/03/2021 1532   ALBUMIN 4.5 04/03/2021 1532   AST 20 04/03/2021 1532   ALT 18 04/03/2021 1532   ALKPHOS 67 04/03/2021 1532   BILITOT 0.8 04/03/2021 1532   GFRNONAA >60 06/11/2015 2027   GFRAA >60 06/11/2015 2027      Latest Ref Rng & Units  04/03/2021    3:32 PM 06/11/2015    8:27 PM  Hepatic Function  Total Protein 6.0 - 8.3 g/dL 7.8  7.7   Albumin 3.5 - 5.2 g/dL 4.5  4.3   AST 0 - 37 U/L 20  21   ALT 0 - 35 U/L 18  18   Alk Phosphatase 39 - 117 U/L 67  67   Total Bilirubin 0.2 - 1.2 mg/dL 0.8  0.4       Current Medications:     Current Outpatient Medications (Cardiovascular):    EPINEPHrine  0.3 mg/0.3 mL IJ SOAJ injection, Use as directed for life-threatening allergic reaction.   simvastatin (ZOCOR) 20 MG tablet, Take 20 mg by mouth at bedtime.   Current Outpatient Medications (Respiratory):    albuterol  (VENTOLIN  HFA) 108 (90 Base) MCG/ACT inhaler, INHALE 2 PUFFS INTO THE LUNGS EVERY 4 TO 6 HOURS AS NEEDED FOR COUGH AND WHEEZING   azelastine  (ASTELIN ) 0.1 % nasal spray, Use two sprays in each nostril twice daily if needed   fluticasone  (FLONASE ) 50 MCG/ACT nasal spray, Use two sprays in each nostril once daily as directed   loratadine  (CLARITIN ) 10 MG tablet, TAKE 1 TABLET BY MOUTH EVERY DAY AS NEEDED   mometasone -formoterol  (DULERA ) 200-5 MCG/ACT AERO, INHALE 2 PUFFS BY MOUTH TWICE DAILY TO PREVENT COUGH OR WHEEZE.  RINSE, GARGLE AND SPIT AFTER USE   omalizumab  (XOLAIR ) 300 MG/2  ML prefilled syringe, Inject 300 mg into the skin every 28 (twenty-eight) days.  Current Facility-Administered Medications (Respiratory):    omalizumab  (XOLAIR ) prefilled syringe 300 mg      Current Outpatient Medications (Other):    doxepin (SINEQUAN) 25 MG capsule,    famotidine  (PEPCID ) 20 MG tablet, TAKE 1 TABLET BY MOUTH TWICE DAILY AS DIRECTED   linaclotide  (LINZESS ) 72 MCG capsule, Take 1 capsule (72 mcg total) by mouth daily before breakfast. Please call [phone] to schedule an office visit for more refills   metroNIDAZOLE  (METROCREAM ) 0.75 % cream, Apply topically 2 (two) times daily.   ondansetron  (ZOFRAN ) 8 MG tablet, Take 1 tablet (8 mg total) by mouth every 8 (eight) hours as needed for nausea or vomiting.    pantoprazole  (PROTONIX ) 40 MG tablet, Take 1 tablet (40 mg total) by mouth daily.   trimethoprim -polymyxin b  (POLYTRIM ) ophthalmic solution, Apply 1-2 drops into affected eye QID x 5 days.   Wheat Dextrin (BENEFIBER) POWD, 1 TSBP daily for bowel habits   Medical History:  Past Medical History:  Diagnosis Date   Abnormal tympanic membrane of both ears    Acid reflux    Anxiety    Asthma    Depression    History of TMJ disorder    Hypertension    Hypoglycemia    Sacroiliitis (HCC)    Suicide attempt (HCC)    x 3   Syncope and collapse    Urticaria    Allergies:  Allergies  Allergen Reactions   Other Hives and Other (See Comments)    Seafood - throat closes up Seasonal allergies, dogs, cats Corn, tomoato   Penicillins Swelling    Throat, tongue, rash    Sertraline Rash and Swelling    Tongue, throat and rash     Sertraline Hcl Hives and Rash   Effexor [Venlafaxine] Hives   Latex Hives   Septra [Sulfamethoxazole-Trimethoprim ] Other (See Comments)    GI upset   Adhesive [Tape] Hives and Rash   Azithromycin Rash   Citalopram Rash     Surgical History:  She  has a past surgical history that includes Abdominal hysterectomy and Wisdom tooth extraction. Family History:  Her family history includes Cancer in her paternal grandfather; Esophageal cancer in her paternal grandfather; Heart disease in her father; Hypertension in her father; Multiple sclerosis in her father; Prostate cancer in her maternal grandfather.  REVIEW OF SYSTEMS  : All other systems reviewed and negative except where noted in the History of Present Illness.  PHYSICAL EXAM: BP 110/62   Pulse 84   Ht 5' 1 (1.549 m)   Wt 129 lb (58.5 kg)   LMP 02/19/2007 (Approximate)   BMI 24.37 kg/m  Physical Exam   GENERAL APPEARANCE: Well nourished, in no apparent distress. HEENT: No cervical lymphadenopathy, unremarkable thyroid , sclerae anicteric, conjunctiva pink. RESPIRATORY: Respiratory effort normal,  breath sounds equal bilaterally without rales, rhonchi, or wheezing. CARDIO: Regular rate and rhythm with no murmurs, rubs, or gallops, peripheral pulses intact. ABDOMEN: Soft, non-distended, active bowel sounds in all four quadrants, right upper quadrant pain with positive Murphy's sign, no rebound, no mass appreciated. RECTAL: Declines. MUSCULOSKELETAL: Full range of motion, normal gait, without edema. SKIN: Dry, intact without rashes or lesions. No jaundice. NEURO: Alert, oriented, no focal deficits. PSYCH: Cooperative, normal mood and affect.      Edmonia Gottron, PA-C 3:43 PM

## 2023-08-04 NOTE — Patient Instructions (Addendum)
 Your provider has requested that you go to the basement level for lab work before leaving today. Press B on the elevator. The lab is located at the first door on the left as you exit the elevator.  Your provider has requested that you have an abdominal x ray before leaving today. Please go to the basement floor to our Radiology department for the test.  Toileting tips to help with your constipation  FIBER SUPPLEMENT You can do metamucil or fibercon once or twice a day but if this causes gas/bloating please switch to Benefiber or Citracel.  Fiber is good for constipation/diarrhea/irritable bowel syndrome.  It can also help with weight loss and can help lower your bad cholesterol (LDL).  Please do 1 TBSP in the morning in water, coffee, or tea.  It can take up to a month before you can see a difference with your bowel movements.  It is cheapest from [employer], [employer], [employer].   - Drink at least 64-80 ounces of water/liquid per day. - Establish a time to try to move your bowels every day.  For many people, this is after a cup of coffee or after a meal such as breakfast. - Sit all of the way back on the toilet keeping your back fairly straight and while sitting up, try to rest the tops of your forearms on your upper thighs.   - Raising your feet with a step stool/squatty potty can be helpful to improve the angle that allows your stool to pass through the rectum. - Relax the rectum feeling it bulge toward the toilet water.  If you feel your rectum raising toward your body, you are contracting rather than relaxing. - Breathe in and slowly exhale. Belly breath by expanding your belly towards your belly button. Keep belly expanded as you gently direct pressure down and back to the anus.  A low pitched GRRR sound can assist with increasing intra-abdominal pressure.  (Can also trying to blow on a pinwheel and make it move, this helps with the same belly breathing) - Repeat 3-4 times. If unsuccessful,  contract the pelvic floor to restore normal tone and get off the toilet.  Avoid excessive straining. - To reduce excessive wiping by teaching your anus to normally contract, place hands on outer aspect of knees and resist knee movement outward.  Hold 5-10 second then place hands just inside of knees and resist inward movement of knees.  Hold 5 seconds.  Repeat a few times each way.  Go to the ER if unable to pass gas, severe AB pain, unable to hold down food, any shortness of breath of chest pain.  First do a trial off milk/lactose products if you use them.  Add fiber like benefiber or citracel once a day Increase activity    FODMAP stands for fermentable oligo-, di-, mono-saccharides and polyols (1). These are the scientific terms used to classify groups of carbs that are difficult for our body to digest and that are notorious for triggering digestive symptoms like bloating, gas, loose stools and stomach pain.   You can try low FODMAP diet  - start with eliminating just one column at a time that you feel may be a trigger for you. - the table at the very bottom contains foods that are low in FODMAPs   Sometimes trying to eliminate the FODMAP's from your diet is difficult or tricky, if you are stuggling with trying to do the elimination diet you can try an enzyme.  There is  a food enzymes that you sprinkle in or on your food that helps break down the FODMAP. You can read more about the enzyme by going to this site: https://fodzyme.com/    What is pelvic floor dysfunction?  For most people, having a bowel movement is a seemingly automatic function. For some individuals, the process of evacuating stool may be difficult. Symptoms of pelvic floor dysfunction include constipation and the sensation of incomplete emptying of the rectum when having a bowel movement. Incomplete emptying may result in the individual feeling the need to attempt a bowel movement several times within a short period of  time. Residual stool left in the rectum may slowly seep out of the rectum leading to reports of bowel incontinence.  The process of defecation (having a bowel movement) requires the coordinated effort of different muscles. The pelvic floor is made up of several muscles that support the rectum like a hammock. When an individual wants to have a bowel movement the pelvic floor muscles are supposed to relax allowing the rectum to empty. While the pelvic floor muscles are relaxing, muscles of the abdomen contract to help push the stool out of the rectum. Individuals with pelvic floor dysfunction have a tendency to contract instead of relax the pelvic floor muscles. When this happens during an attempted bowel movement, these individuals are effectively pushing against an unyielding muscular wall.  It can also cause abnormal urination such as incontinence, reduced stream, urgency, etc.  Sexual dysfunction problems in men and women may be related and treatable as well.   How is pelvic floor dysfunction treated?  Pelvic floor dysfunction due to non-relaxation of the pelvic floor muscles may be treated with specialized physical therapy known as biofeedback. With biofeedback, a therapist helps to improve a person's rectal sensation and pelvic floor muscle coordination. There are various effective techniques used in biofeedback. Some therapists train patients by teaching them to expel a small balloon placed in the rectum. Another technique uses a small probe placed in the rectum or vagina or electrodes placed on the surface of the skin around the opening to the rectum (anus) and on the abdominal wall.  These instruments detect when a muscle is contracting or relaxing and provide visual feedback of the muscle action. This visual feedback helps the individual to understand the muscle movement and aids in improving muscle coordination. Approximately 75% of individuals with pelvic floor dysfunction experience significant  improvement with biofeedback.  Abnormalities identified with a defecating proctogram such as rectal prolapse and rectocele may be treated with a surgical procedure.  ___________________________  Cannabinoid Hyperemesis Syndrome  What is cannabinoid hyperemesis syndrome?  Cannabinoid hyperemesis syndrome (CHS) is a condition that leads to repeated and severe bouts of vomiting. It is rare and only occurs in daily long-term users of marijuana.  Marijuana has several active substances. These include THC and related chemicals. These substances bind to molecules found in the brain. That causes the drug "high" and other effects that users feel.  Your digestive tract also has a number of molecules that bind to [facility] and related substances. So marijuana also affects the digestive tract. For example, the drug can change the time it takes the stomach to empty. It also affects the esophageal sphincter. That's the tight band of muscle that opens and closes to let food from the esophagus into the stomach. Long-term marijuana use can change the way the affected molecules respond and lead to the symptoms of CHS.  Marijuana is the most widely used recreational  drug in the [place] Young adults are the most frequent users. A small number of these people develop CHS. It often only happens in people who have regularly used marijuana. Often CHS affects those who use the drug at least once a day.  What causes cannabinoid hyperemesis syndrome?  Marijuana has very complex effects on the body. Experts are still trying to learn exactly how it causes CHS in some people.  In the brain, marijuana often has the opposite effect of CHS. It helps prevent nausea and vomiting. The drug is also good at stopping such symptoms in people having chemotherapy.  But in the digestive tract, marijuana seems to have the opposite effect. It actually makes you more likely to have nausea and vomiting. With the first use of marijuana, the signals  from the brain may be more important. That may lead to anti-nausea effects at first. But with repeated use of marijuana, certain receptors in the brain may stop responding to the drug in the same way. That may cause the repeated bouts of vomiting found in people with CHS.  It still isn't clear why some heavy marijuana users get the syndrome, but others don't.  What are the symptoms of cannabinoid hyperemesis syndrome?  People with CHS suffer from repeated bouts of vomiting. In between these episodes are times without any symptoms. Healthcare providers often divide these symptoms into 3 stages: the prodromal phase, the hyperemetic phase, and the recovery phase.  Prodromal phase. During this phase, the main symptoms are often early morning nausea and belly (abdominal) pain. Some people also develop a fear of vomiting. Most people keep normal eating patterns during this time. Some people use more marijuana because they think it will help stop the nausea. This phase may last for months or years.  Hyperemetic phase. Symptoms during this time may include:  Ongoing nausea  Repeated episodes of vomiting  Belly pain  Decreased food intake and weight loss  Symptoms of fluid loss (dehydration)  During this phase, vomiting is often intense and overwhelming. Many people take a lot of hot showers during the day. They find that doing so eases their nausea. (That may be because of how the hot temperature affects a part of the brain called the hypothalamus. This part of the brain effects both temperature regulation and vomiting.) People often first seek medical care during this phase.  The hyperemetic phase may continue until the person completely stops using marijuana. Then the recovery phase starts.  Recovery phase. During this time, symptoms go away. Normal eating is possible again. This phase can last days or months. Symptoms often come back if the person tries marijuana again.  How is cannabinoid  hyperemesis syndrome diagnosed?  Many health problems can cause repeated vomiting. To make a diagnosis, your healthcare provider will ask you about your symptoms and your past health. He or she will also do a physical exam, including an exam of your belly. Often this diagnosis can be made from history alone, however.  Your healthcare provider may also need more tests to rule out other causes of the vomiting.  CHS was only recently discovered. So some healthcare providers may not know about it. As a result, they may not spot it for many years. They often confuse CHS with cyclical vomiting disorder. That is a health problem that causes similar symptoms. A specialist trained in diseases of the digestive tract (gastroenterologist) might make the diagnosis.  You may have CHS if you have all of these:  Long-term weekly  and daily marijuana use  Belly pain  Severe, repeated nausea and vomiting  You feel better after taking a hot shower  There is no single test that confirms this diagnosis. Only improvement after quitting marijuana confirms the diagnosis.  How is cannabinoid hyperemesis syndrome treated?  To fully get better, you need to stop using marijuana all together. If you stop using marijuana, your symptoms should not come back.  Symptoms often ease after a day or 2 unless marijuana is used before this time.  In a small sample of people with CHS, rubbing capsaicin cream on the belly helped decrease pain and nausea. The chemicals in the cream have the same effect as a hot shower.  Quitting marijuana may lead to other health benefits, such as:  Better lung function  Improved memory and thinking skills  Better sleep  Key points about cannabinoid hyperemesis syndrome  CHS is a condition that leads to repeated and severe bouts of vomiting. It results from long-term use of marijuana.  Most people self-treat using hot showers to help reduce their symptoms.  Some people with CHS may  not be diagnosed for several years. Admitting to your healthcare provider that you use marijuana daily can speed up the diagnosis.  Symptoms start to go away within a day or 2 after stopping marijuana use.  Symptoms can come back if you use marijuana again  You have been scheduled for an abdominal ultrasound at [facility] (1st floor of hospital) on 08/05/2023 at 11am. Please arrive 30 minutes prior to your appointment for registration. Make certain not to have anything to eat or drink 6 hours prior to your appointment. Should you need to reschedule your appointment, please contact radiology at [phone]. This test typically takes about 30 minutes to perform.   Due to recent changes in healthcare laws, you may see the results of your imaging and laboratory studies on MyChart before your provider has had a chance to review them.  We understand that in some cases there may be results that are confusing or concerning to you. Not all laboratory results come back in the same time frame and the provider may be waiting for multiple results in order to interpret others.  Please give us  48 hours in order for your provider to thoroughly review all the results before contacting the office for clarification of your results.

## 2023-08-05 ENCOUNTER — Ambulatory Visit (HOSPITAL_BASED_OUTPATIENT_CLINIC_OR_DEPARTMENT_OTHER)
Admission: RE | Admit: 2023-08-05 | Discharge: 2023-08-05 | Disposition: A | Discharge status: Home / Self Care | Source: Ambulatory Visit | Attending: Physician Assistant | Admitting: Physician Assistant

## 2023-08-05 ENCOUNTER — Ambulatory Visit: Payer: Self-pay | Admitting: Physician Assistant

## 2023-08-05 DIAGNOSIS — R110 Nausea: Secondary | ICD-10-CM | POA: Diagnosis not present

## 2023-08-05 DIAGNOSIS — K580 Irritable bowel syndrome with diarrhea: Secondary | ICD-10-CM | POA: Insufficient documentation

## 2023-08-05 DIAGNOSIS — R1011 Right upper quadrant pain: Secondary | ICD-10-CM | POA: Diagnosis not present

## 2023-08-05 DIAGNOSIS — R932 Abnormal findings on diagnostic imaging of liver and biliary tract: Secondary | ICD-10-CM | POA: Diagnosis not present

## 2023-08-05 DIAGNOSIS — K219 Gastro-esophageal reflux disease without esophagitis: Secondary | ICD-10-CM | POA: Insufficient documentation

## 2023-08-05 DIAGNOSIS — R1319 Other dysphagia: Secondary | ICD-10-CM | POA: Diagnosis not present

## 2023-08-06 DIAGNOSIS — Z6823 Body mass index (BMI) 23.0-23.9, adult: Secondary | ICD-10-CM | POA: Diagnosis not present

## 2023-08-06 DIAGNOSIS — E785 Hyperlipidemia, unspecified: Secondary | ICD-10-CM | POA: Diagnosis not present

## 2023-08-06 DIAGNOSIS — F411 Generalized anxiety disorder: Secondary | ICD-10-CM | POA: Diagnosis not present

## 2023-08-06 DIAGNOSIS — F410 Panic disorder [episodic paroxysmal anxiety] without agoraphobia: Secondary | ICD-10-CM | POA: Diagnosis not present

## 2023-08-06 DIAGNOSIS — I10 Essential (primary) hypertension: Secondary | ICD-10-CM | POA: Diagnosis not present

## 2023-08-06 DIAGNOSIS — R7989 Other specified abnormal findings of blood chemistry: Secondary | ICD-10-CM | POA: Diagnosis not present

## 2023-08-08 ENCOUNTER — Other Ambulatory Visit: Payer: Self-pay | Admitting: Gastroenterology

## 2023-08-13 ENCOUNTER — Other Ambulatory Visit: Payer: Self-pay | Admitting: Allergy and Immunology

## 2023-08-16 ENCOUNTER — Telehealth: Admitting: Nurse Practitioner

## 2023-08-16 DIAGNOSIS — K047 Periapical abscess without sinus: Secondary | ICD-10-CM

## 2023-08-16 MED ORDER — CLINDAMYCIN HCL 300 MG PO CAPS: 300.0000 mg | ORAL_CAPSULE | Freq: Three times a day (TID) | ORAL | 0 refills | Status: AC

## 2023-08-16 MED ORDER — NAPROXEN 500 MG PO TABS: 500.0000 mg | ORAL_TABLET | Freq: Two times a day (BID) | ORAL | 0 refills | Status: DC

## 2023-08-16 NOTE — Progress Notes (Signed)
 I have spent 5 minutes in review of e-visit questionnaire, review and updating patient chart, medical decision making and response to patient.   Claiborne Rigg, NP

## 2023-08-16 NOTE — Progress Notes (Signed)
 E-Visit for Dental Pain  We are sorry that you are not feeling well.  Here is how we plan to help!  Based on what you have shared with me in the questionnaire, we will be sending   Clindamycin 300mg  3 times a day for 7 days and Naprosyn 500mg  2 times a day for 7 days for discomfort  It is imperative that you see a dentist within 10 days of this eVisit to determine the cause of the dental pain and be sure it is adequately treated  A toothache or tooth pain is caused when the nerve in the root of a tooth or surrounding a tooth is irritated. Dental (tooth) infection, decay, injury, or loss of a tooth are the most common causes of dental pain. Pain may also occur after an extraction (tooth is pulled out). Pain sometimes originates from other areas and radiates to the jaw, thus appearing to be tooth pain.Bacteria growing inside your mouth can contribute to gum disease and dental decay, both of which can cause pain. A toothache occurs from inflammation of the central portion of the tooth called pulp. The pulp contains nerve endings that are very sensitive to pain. Inflammation to the pulp or pulpitis may be caused by dental cavities, trauma, and infection.    HOME CARE:   For toothaches: Over-the-counter pain medications such as acetaminophen  or ibuprofen  may be used. Take these as directed on the package while you arrange for a dental appointment. Avoid very cold or hot foods, because they may make the pain worse. You may get relief from biting on a cotton ball soaked in oil of cloves. You can get oil of cloves at most drug stores.  For jaw pain:  Aspirin may be helpful for problems in the joint of the jaw in adults. If pain happens every time you open your mouth widely, the temporomandibular joint (TMJ) may be the source of the pain. Yawning or taking a large bite of food may worsen the pain. An appointment with your doctor or dentist will help you find the cause.     GET HELP RIGHT AWAY  IF:  You have a high fever or chills If you have had a recent head or face injury and develop headache, light headedness, nausea, vomiting, or other symptoms that concern you after an injury to your face or mouth, you could have a more serious injury in addition to your dental injury. A facial rash associated with a toothache: This condition may improve with medication. Contact your doctor for them to decide what is appropriate. Any jaw pain occurring with chest pain: Although jaw pain is most commonly caused by dental disease, it is sometimes referred pain from other areas. People with heart disease, especially people who have had stents placed, people with diabetes, or those who have had heart surgery may have jaw pain as a symptom of heart attack or angina. If your jaw or tooth pain is associated with lightheadedness, sweating, or shortness of breath, you should see a doctor as soon as possible. Trouble swallowing or excessive pain or bleeding from gums: If you have a history of a weakened immune system, diabetes, or steroid use, you may be more susceptible to infections. Infections can often be more severe and extensive or caused by unusual organisms. Dental and gum infections in people with these conditions may require more aggressive treatment. An abscess may need draining or IV antibiotics, for example.  MAKE SURE YOU   Understand these instructions. Will watch  your condition. Will get help right away if you are not doing well or get worse.  Thank you for choosing an e-visit.  Your e-visit answers were reviewed by a board certified advanced clinical practitioner to complete your personal care plan. Depending upon the condition, your plan could have included both over the counter or prescription medications.  Please review your pharmacy choice. Make sure the pharmacy is open so you can pick up prescription now. If there is a problem, you may contact your provider through [employer] and  have the prescription routed to another pharmacy.  Your safety is important to us . If you have drug allergies check your prescription carefully.   For the next 24 hours you can use MyChart to ask questions about today's visit, request a non-urgent call back, or ask for a work or school excuse. You will get an email in the next two days asking about your experience. I hope that your e-visit has been valuable and will speed your recovery.

## 2023-08-26 ENCOUNTER — Telehealth: Admitting: Physician Assistant

## 2023-08-26 DIAGNOSIS — N200 Calculus of kidney: Secondary | ICD-10-CM

## 2023-08-26 NOTE — Progress Notes (Signed)
  Because of active kidney stone and associated pain, I feel your condition warrants further evaluation and I recommend that you be seen in a face-to-face visit.   NOTE: There will be NO CHARGE for this E-Visit   If you are having a true medical emergency, please call 911.     For an urgent face to face visit, [facility] has multiple urgent care centers for your convenience.  Click the link below for the full list of locations and hours, walk-in wait times, appointment scheduling options and driving directions:  Urgent Care - [place], [place], [place], [place], [place], [place]  [place]     Your MyChart E-visit questionnaire answers were reviewed by a board certified advanced clinical practitioner to complete your personal care plan based on your specific symptoms.    Thank you for using e-Visits.

## 2023-09-01 DIAGNOSIS — S300XXA Contusion of lower back and pelvis, initial encounter: Secondary | ICD-10-CM | POA: Diagnosis not present

## 2023-09-01 DIAGNOSIS — M7918 Myalgia, other site: Secondary | ICD-10-CM | POA: Diagnosis not present

## 2023-09-03 DIAGNOSIS — I10 Essential (primary) hypertension: Secondary | ICD-10-CM | POA: Diagnosis not present

## 2023-09-09 DIAGNOSIS — Z1331 Encounter for screening for depression: Secondary | ICD-10-CM | POA: Diagnosis not present

## 2023-09-09 DIAGNOSIS — F603 Borderline personality disorder: Secondary | ICD-10-CM | POA: Diagnosis not present

## 2023-09-09 DIAGNOSIS — F410 Panic disorder [episodic paroxysmal anxiety] without agoraphobia: Secondary | ICD-10-CM | POA: Diagnosis not present

## 2023-09-09 DIAGNOSIS — F411 Generalized anxiety disorder: Secondary | ICD-10-CM | POA: Diagnosis not present

## 2023-09-10 ENCOUNTER — Other Ambulatory Visit: Payer: Self-pay | Admitting: Medical Genetics

## 2023-09-11 DIAGNOSIS — F603 Borderline personality disorder: Secondary | ICD-10-CM | POA: Diagnosis not present

## 2023-09-11 DIAGNOSIS — F410 Panic disorder [episodic paroxysmal anxiety] without agoraphobia: Secondary | ICD-10-CM | POA: Diagnosis not present

## 2023-09-11 DIAGNOSIS — F411 Generalized anxiety disorder: Secondary | ICD-10-CM | POA: Diagnosis not present

## 2023-09-15 DIAGNOSIS — J101 Influenza due to other identified influenza virus with other respiratory manifestations: Secondary | ICD-10-CM | POA: Diagnosis not present

## 2023-09-15 DIAGNOSIS — Z6822 Body mass index (BMI) 22.0-22.9, adult: Secondary | ICD-10-CM | POA: Diagnosis not present

## 2023-09-15 DIAGNOSIS — Z1331 Encounter for screening for depression: Secondary | ICD-10-CM | POA: Diagnosis not present

## 2023-09-15 DIAGNOSIS — Z20822 Contact with and (suspected) exposure to covid-19: Secondary | ICD-10-CM | POA: Diagnosis not present

## 2023-09-15 DIAGNOSIS — R6883 Chills (without fever): Secondary | ICD-10-CM | POA: Diagnosis not present

## 2023-09-16 ENCOUNTER — Telehealth: Payer: Self-pay | Admitting: Gastroenterology

## 2023-09-16 NOTE — Telephone Encounter (Signed)
 Good morning Dr. Charlanne, This patient called and stated that she has tested positive for Flu B and Covid. Patient was schedule to have an EGD on July the 30 th but was rescheduled for September the 16 th at 3 PM. Please advise.

## 2023-09-16 NOTE — Telephone Encounter (Signed)
 New instructions sent to pt via MyChart. Called pt to inform her. No answer. Left detailed message reviewing instructions.

## 2023-09-17 ENCOUNTER — Encounter: Admitting: Gastroenterology

## 2023-09-18 DIAGNOSIS — I10 Essential (primary) hypertension: Secondary | ICD-10-CM | POA: Diagnosis not present

## 2023-09-29 DIAGNOSIS — F411 Generalized anxiety disorder: Secondary | ICD-10-CM | POA: Diagnosis not present

## 2023-09-29 DIAGNOSIS — F603 Borderline personality disorder: Secondary | ICD-10-CM | POA: Diagnosis not present

## 2023-09-29 DIAGNOSIS — F331 Major depressive disorder, recurrent, moderate: Secondary | ICD-10-CM | POA: Diagnosis not present

## 2023-10-02 ENCOUNTER — Telehealth: Admitting: Emergency Medicine

## 2023-10-02 ENCOUNTER — Telehealth: Admitting: Physician Assistant

## 2023-10-02 DIAGNOSIS — J019 Acute sinusitis, unspecified: Secondary | ICD-10-CM

## 2023-10-02 DIAGNOSIS — B9689 Other specified bacterial agents as the cause of diseases classified elsewhere: Secondary | ICD-10-CM

## 2023-10-02 DIAGNOSIS — R059 Cough, unspecified: Secondary | ICD-10-CM

## 2023-10-02 MED ORDER — BENZONATATE 100 MG PO CAPS: 100.0000 mg | ORAL_CAPSULE | Freq: Three times a day (TID) | ORAL | 0 refills | Status: DC | PRN

## 2023-10-02 MED ORDER — DOXYCYCLINE HYCLATE 100 MG PO TABS: 100.0000 mg | ORAL_TABLET | Freq: Two times a day (BID) | ORAL | 0 refills | Status: AC

## 2023-10-02 NOTE — Progress Notes (Signed)
 Virtual Visit Consent   Tara Schmidt, you are scheduled for a virtual visit with a [facility] provider today. Just as with appointments in the office, your consent must be obtained to participate. Your consent will be active for this visit and any virtual visit you may have with one of our providers in the next 365 days. If you have a MyChart account, a copy of this consent can be sent to you electronically.  As this is a virtual visit, video technology does not allow for your provider to perform a traditional examination. This may limit your provider's ability to fully assess your condition. If your provider identifies any concerns that need to be evaluated in person or the need to arrange testing (such as labs, EKG, etc.), we will make arrangements to do so. Although advances in technology are sophisticated, we cannot ensure that it will always work on either your end or our end. If the connection with a video visit is poor, the visit may have to be switched to a telephone visit. With either a video or telephone visit, we are not always able to ensure that we have a secure connection.  By engaging in this virtual visit, you consent to the provision of healthcare and authorize for your insurance to be billed (if applicable) for the services provided during this visit. Depending on your insurance coverage, you may receive a charge related to this service.  I need to obtain your verbal consent now. Are you willing to proceed with your visit today? Theda Scarpino has provided verbal consent on 10/02/2023 for a virtual visit (video or telephone). Tara Schmidt, [place]  Date: 10/02/2023 11:27 AM   Virtual Visit via Video Note   I, Tara Schmidt, connected with  Kerilyn Cortner  ([id number], 06/16/1980) on 10/02/23 at 11:00 AM EDT by a video-enabled telemedicine application and verified that I am speaking with the correct person using two identifiers.  Location: Patient: Virtual Visit Location Patient:  Home Provider: Virtual Visit Location Provider: Home Office   I discussed the limitations of evaluation and management by telemedicine and the availability of in person appointments. The patient expressed understanding and agreed to proceed.    History of Present Illness: Tara Schmidt is a 43 y.o. who identifies as a female who was assigned female at birth, and is being seen today for some persistent sinus symptoms and cough after recent diagnosis and treatment for COVID.  Notes feeling better from initial symptoms, taking her Tamiflu as directed. Noting increased nasal congestion, sinus pressure/pain. Itchy throat and cough secondary to drainage.  Notes some residual shortness of breath -- controlled with her albuterol  inhaler. OTC -- cough medications, cough drops, tylenol . Finished Tamiflu.     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    Allergies:  Allergies  Allergen Reactions   Other Hives and Other (See Comments)    Seafood - throat closes up Seasonal allergies, dogs, cats Corn, tomoato   Penicillins Swelling    Throat, tongue, rash    Sertraline Rash and Swelling    Tongue, throat and rash     Sertraline Hcl Hives and Rash   Effexor [Venlafaxine] Hives   Latex Hives   Septra [Sulfamethoxazole-Trimethoprim ] Other (See Comments)    GI upset   Adhesive [Tape] Hives and Rash   Azithromycin Rash   Citalopram Rash   Medications:  Current Outpatient Medications:  albuterol  (VENTOLIN  HFA) 108 (90 Base) MCG/ACT inhaler, INHALE 2 PUFFS INTO THE LUNGS EVERY 4 TO 6 HOURS AS NEEDED FOR COUGH AND WHEEZING, Disp: 8.5 g, Rfl: 0   azelastine  (ASTELIN ) 0.1 % nasal spray, Use two sprays in each nostril twice daily if needed, Disp: 30 mL, Rfl: 5   doxepin (SINEQUAN) 25 MG capsule, , Disp: , Rfl:    EPINEPHrine  0.3 mg/0.3 mL IJ SOAJ injection, Use as directed for life-threatening allergic  reaction., Disp: 2 each, Rfl: 1   famotidine  (PEPCID ) 20 MG tablet, TAKE 1 TABLET BY MOUTH TWICE DAILY AS DIRECTED, Disp: 60 tablet, Rfl: 2   fluticasone  (FLONASE ) 50 MCG/ACT nasal spray, Use two sprays in each nostril once daily as directed, Disp: 16 g, Rfl: 5   LINZESS  72 MCG capsule, TAKE 1 CAPSULE(72 MCG) BY MOUTH DAILY BEFORE BREAKFASTNEED APPT*, Disp: 90 capsule, Rfl: 0   loratadine  (CLARITIN ) 10 MG tablet, TAKE 1 TABLET BY MOUTH EVERY DAY AS NEEDED, Disp: 30 tablet, Rfl: 3   metroNIDAZOLE  (METROCREAM ) 0.75 % cream, Apply topically 2 (two) times daily., Disp: 45 g, Rfl: 2   mometasone -formoterol  (DULERA ) 200-5 MCG/ACT AERO, INHALE 2 PUFFS BY MOUTH TWICE DAILY TO PREVENT COUGH OR WHEEZE. RINSE, GARGLE AND SPIT AFTER USE, Disp: 13 g, Rfl: 1   naproxen  (NAPROSYN ) 500 MG tablet, Take 1 tablet (500 mg total) by mouth 2 (two) times daily with a meal., Disp: 30 tablet, Rfl: 0   ondansetron  (ZOFRAN ) 8 MG tablet, Take 1 tablet (8 mg total) by mouth every 8 (eight) hours as needed for nausea or vomiting., Disp: 30 tablet, Rfl: 2   pantoprazole  (PROTONIX ) 40 MG tablet, Take 1 tablet (40 mg total) by mouth daily., Disp: 90 tablet, Rfl: 0   simvastatin (ZOCOR) 20 MG tablet, Take 20 mg by mouth at bedtime., Disp: , Rfl:    trimethoprim -polymyxin b  (POLYTRIM ) ophthalmic solution, Apply 1-2 drops into affected eye QID x 5 days., Disp: 10 mL, Rfl: 0   Wheat Dextrin (BENEFIBER) POWD, 1 TSBP daily for bowel habits, Disp: 529 g, Rfl: 0   XOLAIR  300 MG/2ML prefilled syringe, INJECT 1 SYRINGE UNDER THE SKIN EVERY 4 WEEKS, Disp: 2 mL, Rfl: 11  Current Facility-Administered Medications:    omalizumab  (XOLAIR ) prefilled syringe 300 mg, 300 mg, Subcutaneous, Q28 days, Kozlow, Camellia PARAS, MD, 300 mg at 07/03/22 1353  Observations/Objective: Patient is well-developed, well-nourished in no acute distress.  Resting comfortably at home.  Head is normocephalic, atraumatic.  No labored breathing. Speech is clear and  coherent with logical content.  Patient is alert and oriented at baseline.    Assessment and Plan: 1. Acute bacterial sinusitis (Primary) - benzonatate  (TESSALON ) 100 MG capsule; Take 1 capsule (100 mg total) by mouth 3 (three) times daily as needed for cough.  Dispense: 30 capsule; Refill: 0 - doxycycline  (VIBRA -TABS) 100 MG tablet; Take 1 tablet (100 mg total) by mouth 2 (two) times daily.  Dispense: 20 tablet; Refill: 0  Post COVID and Flu B. Rx Doxycycline .  Increase fluids.  Rest.  Saline nasal spray.  Probiotic.  Mucinex as directed.  Humidifier in bedroom. Tessalon  per orders.  Call or return to clinic if symptoms are not improving.   Follow Up Instructions: I discussed the assessment and treatment plan with the patient. The patient was provided an opportunity to ask questions and all were answered. The patient agreed with the plan and demonstrated an understanding of the instructions.  A copy of instructions were sent to the patient  via MyChart unless otherwise noted below.    The patient was advised to call back or seek an in-person evaluation if the symptoms worsen or if the condition fails to improve as anticipated.    Tara Velma Lunger, PA-C

## 2023-10-02 NOTE — Patient Instructions (Signed)
 Gaylan Phenix, thank you for joining Elsie Velma Lunger, PA-C for today's virtual visit.  While this provider is not your primary care provider (PCP), if your PCP is located in our provider database this encounter information will be shared with them immediately following your visit.   A [facility] MyChart account gives you access to today's visit and all your visits, tests, and labs performed at [facility]  click here if you don't have a  MyChart account or go to [email or web address]  Consent: (Patient) Tara Schmidt provided verbal consent for this virtual visit at the beginning of the encounter.  Current Medications:  Current Outpatient Medications:    albuterol  (VENTOLIN  HFA) 108 (90 Base) MCG/ACT inhaler, INHALE 2 PUFFS INTO THE LUNGS EVERY 4 TO 6 HOURS AS NEEDED FOR COUGH AND WHEEZING, Disp: 8.5 g, Rfl: 0   azelastine  (ASTELIN ) 0.1 % nasal spray, Use two sprays in each nostril twice daily if needed, Disp: 30 mL, Rfl: 5   doxepin (SINEQUAN) 25 MG capsule, , Disp: , Rfl:    EPINEPHrine  0.3 mg/0.3 mL IJ SOAJ injection, Use as directed for life-threatening allergic reaction., Disp: 2 each, Rfl: 1   famotidine  (PEPCID ) 20 MG tablet, TAKE 1 TABLET BY MOUTH TWICE DAILY AS DIRECTED, Disp: 60 tablet, Rfl: 2   fluticasone  (FLONASE ) 50 MCG/ACT nasal spray, Use two sprays in each nostril once daily as directed, Disp: 16 g, Rfl: 5   LINZESS  72 MCG capsule, TAKE 1 CAPSULE(72 MCG) BY MOUTH DAILY BEFORE BREAKFASTNEED APPT*, Disp: 90 capsule, Rfl: 0   loratadine  (CLARITIN ) 10 MG tablet, TAKE 1 TABLET BY MOUTH EVERY DAY AS NEEDED, Disp: 30 tablet, Rfl: 3   metroNIDAZOLE  (METROCREAM ) 0.75 % cream, Apply topically 2 (two) times daily., Disp: 45 g, Rfl: 2   mometasone -formoterol  (DULERA ) 200-5 MCG/ACT AERO, INHALE 2 PUFFS BY MOUTH TWICE DAILY TO PREVENT COUGH OR WHEEZE. RINSE, GARGLE AND SPIT AFTER USE, Disp: 13 g, Rfl: 1   ondansetron  (ZOFRAN ) 8 MG tablet, Take 1 tablet (8 mg  total) by mouth every 8 (eight) hours as needed for nausea or vomiting., Disp: 30 tablet, Rfl: 2   pantoprazole  (PROTONIX ) 40 MG tablet, Take 1 tablet (40 mg total) by mouth daily., Disp: 90 tablet, Rfl: 0   simvastatin (ZOCOR) 20 MG tablet, Take 20 mg by mouth at bedtime., Disp: , Rfl:    trimethoprim -polymyxin b  (POLYTRIM ) ophthalmic solution, Apply 1-2 drops into affected eye QID x 5 days., Disp: 10 mL, Rfl: 0   Wheat Dextrin (BENEFIBER) POWD, 1 TSBP daily for bowel habits, Disp: 529 g, Rfl: 0   XOLAIR  300 MG/2ML prefilled syringe, INJECT 1 SYRINGE UNDER THE SKIN EVERY 4 WEEKS, Disp: 2 mL, Rfl: 11  Current Facility-Administered Medications:    omalizumab  (XOLAIR ) prefilled syringe 300 mg, 300 mg, Subcutaneous, Q28 days, Kozlow, Eric J, MD, 300 mg at 07/03/22 1353   Medications ordered in this encounter:  No orders of the defined types were placed in this encounter.    *If you need refills on other medications prior to your next appointment, please contact your pharmacy*  Follow-Up: Call back or seek an in-person evaluation if the symptoms worsen or if the condition fails to improve as anticipated.  [facility] [phone]  Other Instructions Please take antibiotic as directed.  Increase fluid intake.  Use Saline nasal spray.  Take a daily multivitamin. Use the Tessalon  as directed for cough.  Place a humidifier in the bedroom.  If you note any non-resolving,  new, or worsening symptoms despite treatment, please seek an in-person evaluation ASAP.   Sinusitis Sinusitis is redness, soreness, and swelling (inflammation) of the paranasal sinuses. Paranasal sinuses are air pockets within the bones of your face (beneath the eyes, the middle of the forehead, or above the eyes). In healthy paranasal sinuses, mucus is able to drain out, and air is able to circulate through them by way of your nose. However, when your paranasal sinuses are inflamed, mucus and air can become trapped.  This can allow bacteria and other germs to grow and cause infection. Sinusitis can develop quickly and last only a short time (acute) or continue over a long period (chronic). Sinusitis that lasts for more than 12 weeks is considered chronic.  CAUSES  Causes of sinusitis include: Allergies. Structural abnormalities, such as displacement of the cartilage that separates your nostrils (deviated septum), which can decrease the air flow through your nose and sinuses and affect sinus drainage. Functional abnormalities, such as when the small hairs (cilia) that line your sinuses and help remove mucus do not work properly or are not present. SYMPTOMS  Symptoms of acute and chronic sinusitis are the same. The primary symptoms are pain and pressure around the affected sinuses. Other symptoms include: Upper toothache. Earache. Headache. Bad breath. Decreased sense of smell and taste. A cough, which worsens when you are lying flat. Fatigue. Fever. Thick drainage from your nose, which often is green and may contain pus (purulent). Swelling and warmth over the affected sinuses. DIAGNOSIS  Your caregiver will perform a physical exam. During the exam, your caregiver may: Look in your nose for signs of abnormal growths in your nostrils (nasal polyps). Tap over the affected sinus to check for signs of infection. View the inside of your sinuses (endoscopy) with a special imaging device with a light attached (endoscope), which is inserted into your sinuses. If your caregiver suspects that you have chronic sinusitis, one or more of the following tests may be recommended: Allergy  tests. Nasal culture A sample of mucus is taken from your nose and sent to a lab and screened for bacteria. Nasal cytology A sample of mucus is taken from your nose and examined by your caregiver to determine if your sinusitis is related to an allergy . TREATMENT  Most cases of acute sinusitis are related to a viral infection and will  resolve on their own within 10 days. Sometimes medicines are prescribed to help relieve symptoms (pain medicine, decongestants, nasal steroid sprays, or saline sprays).  However, for sinusitis related to a bacterial infection, your caregiver will prescribe antibiotic medicines. These are medicines that will help kill the bacteria causing the infection.  Rarely, sinusitis is caused by a fungal infection. In theses cases, your caregiver will prescribe antifungal medicine. For some cases of chronic sinusitis, surgery is needed. Generally, these are cases in which sinusitis recurs more than 3 times per year, despite other treatments. HOME CARE INSTRUCTIONS  Drink plenty of water. Water helps thin the mucus so your sinuses can drain more easily. Use a humidifier. Inhale steam 3 to 4 times a day (for example, sit in the bathroom with the shower running). Apply a warm, moist washcloth to your face 3 to 4 times a day, or as directed by your caregiver. Use saline nasal sprays to help moisten and clean your sinuses. Take over-the-counter or prescription medicines for pain, discomfort, or fever only as directed by your caregiver. SEEK IMMEDIATE MEDICAL CARE IF: You have increasing pain or severe headaches. You  have nausea, vomiting, or drowsiness. You have swelling around your face. You have vision problems. You have a stiff neck. You have difficulty breathing. MAKE SURE YOU:  Understand these instructions. Will watch your condition. Will get help right away if you are not doing well or get worse. Document Released: 02/04/2005 Document Revised: 04/29/2011 Document Reviewed: 02/19/2011 [facility] Patient Information 2014 [place], [place].    If you have been instructed to have an in-person evaluation today at a local Urgent Care facility, please use the link below. It will take you to a list of all of our available [facility], including address, phone number and hours of operation. Please  do not delay care.  [facility]  If you or a family member do not have a primary care provider, use the link below to schedule a visit and establish care. When you choose a [facility] primary care physician or advanced practice provider, you gain a long-term partner in health. Find a Primary Care Provider  Learn more about [facility]'s in-office and virtual care options:  - Get Care Now

## 2023-10-02 NOTE — Progress Notes (Signed)
   Thank you for the details you included in the comment boxes. Those details are very helpful in determining the best course of treatment for you and help us  to provide the best care.Because you've had your symptoms for so long, we recommend that you schedule a Virtual Urgent Care video visit in order for the provider to better assess what is going on.  The provider will be able to give you a more accurate diagnosis and treatment plan if we can more freely discuss your symptoms and with the addition of a virtual examination.   Alternatively, you can choose to be seen in person such as at an urgent care.   If you change your visit to a video visit, we will bill your insurance (similar to an office visit) and you will not be charged for this e-Visit. You will be able to stay at home and speak with the first available [facility] advanced practice provider. The link to do a video visit is in the drop down Menu tab of your Welcome screen in MyChart.

## 2023-10-04 ENCOUNTER — Other Ambulatory Visit: Payer: Self-pay | Admitting: Gastroenterology

## 2023-10-16 DIAGNOSIS — F603 Borderline personality disorder: Secondary | ICD-10-CM | POA: Diagnosis not present

## 2023-10-19 DIAGNOSIS — I10 Essential (primary) hypertension: Secondary | ICD-10-CM | POA: Diagnosis not present

## 2023-10-28 ENCOUNTER — Other Ambulatory Visit: Payer: Self-pay | Admitting: Allergy

## 2023-11-04 ENCOUNTER — Encounter: Payer: Self-pay | Admitting: Gastroenterology

## 2023-11-04 ENCOUNTER — Ambulatory Visit: Admitting: Gastroenterology

## 2023-11-04 VITALS — BP 120/88 | HR 86 | Temp 97.9°F | Resp 16 | Ht 61.0 in | Wt 129.0 lb

## 2023-11-04 DIAGNOSIS — K219 Gastro-esophageal reflux disease without esophagitis: Secondary | ICD-10-CM

## 2023-11-04 DIAGNOSIS — F32A Depression, unspecified: Secondary | ICD-10-CM | POA: Diagnosis not present

## 2023-11-04 DIAGNOSIS — R110 Nausea: Secondary | ICD-10-CM

## 2023-11-04 DIAGNOSIS — J45909 Unspecified asthma, uncomplicated: Secondary | ICD-10-CM | POA: Diagnosis not present

## 2023-11-04 DIAGNOSIS — R1310 Dysphagia, unspecified: Secondary | ICD-10-CM

## 2023-11-04 DIAGNOSIS — F419 Anxiety disorder, unspecified: Secondary | ICD-10-CM | POA: Diagnosis not present

## 2023-11-04 DIAGNOSIS — R1319 Other dysphagia: Secondary | ICD-10-CM

## 2023-11-04 MED ORDER — SODIUM CHLORIDE 0.9 % IV SOLN: 500.0000 mL | Freq: Once | INTRAVENOUS | Status: AC

## 2023-11-04 NOTE — Op Note (Signed)
 [facility] Patient Name: Tara Schmidt Procedure Date: 11/04/2023 3:57 PM MRN: [id number] Endoscopist: Lynnie Bring , MD, [id number] Age: 43 Referring MD:  Date of Birth: 1980/03/19 Gender: Female Account #: [id number] Procedure:                Upper GI endoscopy Indications:              Dysphagia Medicines:                Monitored Anesthesia Care Procedure:                Pre-Anesthesia Assessment:                           - Prior to the procedure, a History and Physical                            was performed, and patient medications and                            allergies were reviewed. The patient's tolerance of                            previous anesthesia was also reviewed. The risks                            and benefits of the procedure and the sedation                            options and risks were discussed with the patient.                            All questions were answered, and informed consent                            was obtained. Prior Anticoagulants: The patient has                            taken no anticoagulant or antiplatelet agents. ASA                            Grade Assessment: II - A patient with mild systemic                            disease. After reviewing the risks and benefits,                            the patient was deemed in satisfactory condition to                            undergo the procedure.                           After obtaining informed consent, the endoscope was  passed under direct vision. Throughout the                            procedure, the patient's blood pressure, pulse, and                            oxygen  saturations were monitored continuously. The                            Olympus Scope [id number] was introduced through the                            mouth, and advanced to the second part of duodenum.                            The upper GI endoscopy was accomplished  without                            difficulty. The patient tolerated the procedure                            well. Scope In: Scope Out: Findings:                 No endoscopic abnormality was evident in the                            esophagus to explain the patient's complaint of                            dysphagia. It was decided, however, to proceed with                            dilation of the entire esophagus. The scope was                            withdrawn. Dilation was performed with a Maloney                            dilator with mild resistance at 50 Fr and 52 Fr.                            Biopsies were obtained from the proximal and distal                            esophagus with cold forceps for histology to r/o                            eosinophilic esophagitis.                           The Z-line was regular and was found 36 cm from the  incisors.                           The entire examined stomach was normal.                           The examined duodenum was normal. Complications:            No immediate complications. Estimated Blood Loss:     Estimated blood loss: none. Impression:               - No endoscopic esophageal abnormality to explain                            patient's dysphagia. Esophagus dilated. Dilated.                           - Z-line regular, 36 cm from the incisors.                           - Normal stomach.                           - Normal examined duodenum.                           - Biopsies were taken with a cold forceps for                            evaluation of eosinophilic esophagitis. Recommendation:           - Patient has a contact number available for                            emergencies. The signs and symptoms of potential                            delayed complications were discussed with the                            patient. Return to normal activities tomorrow.                             Written discharge instructions were provided to the                            patient.                           - Postdilatation diet.                           - Continue present medications.                           - Await pathology results.                           -  The findings and recommendations were discussed                            with the designated responsible adult. Lynnie Bring, MD 11/04/2023 4:14:47 PM This report has been signed electronically.

## 2023-11-04 NOTE — Progress Notes (Signed)
 Sedate, gd SR, tolerated procedure well, VSS, report to RN

## 2023-11-04 NOTE — Progress Notes (Signed)
 duplicate

## 2023-11-04 NOTE — Progress Notes (Signed)
 I have reviewed the patient's medical history in detail and updated the computerized patient record.

## 2023-11-04 NOTE — Patient Instructions (Addendum)
-  Await pathology results -Post dilation diet handout provided  YOU HAD AN ENDOSCOPIC PROCEDURE TODAY AT THE  ENDOSCOPY CENTER:   Refer to the procedure report that was given to you for any specific questions about what was found during the examination.  If the procedure report does not answer your questions, please call your gastroenterologist to clarify.  If you requested that your care partner not be given the details of your procedure findings, then the procedure report has been included in a sealed envelope for you to review at your convenience later.  YOU SHOULD EXPECT: Some feelings of bloating in the abdomen. Passage of more gas than usual.  Walking can help get rid of the air that was put into your GI tract during the procedure and reduce the bloating. If you had a lower endoscopy (such as a colonoscopy or flexible sigmoidoscopy) you may notice spotting of blood in your stool or on the toilet paper. If you underwent a bowel prep for your procedure, you may not have a normal bowel movement for a few days.  Please Note:  You might notice some irritation and congestion in your nose or some drainage.  This is from the oxygen  used during your procedure.  There is no need for concern and it should clear up in a day or so.  SYMPTOMS TO REPORT IMMEDIATELY:  Following upper endoscopy (EGD)  Vomiting of blood or coffee ground material  New chest pain or pain under the shoulder blades  Painful or persistently difficult swallowing  New shortness of breath  Fever of 100F or higher  Black, tarry-looking stools  For urgent or emergent issues, a gastroenterologist can be reached at any hour by calling [phone]. Do not use MyChart messaging for urgent concerns.    DIET:  Post dilation diet (see handout). You may proceed to your regular diet tomorrow. Drink plenty of fluids but you should avoid alcoholic beverages for 24 hours.  ACTIVITY:  You should plan to take it easy for the rest of  today and you should NOT DRIVE or use heavy machinery until tomorrow (because of the sedation medicines used during the test).    FOLLOW UP: Our staff will call the number listed on your records the next business day following your procedure.  We will call around 7:15- 8:00 am to check on you and address any questions or concerns that you may have regarding the information given to you following your procedure. If we do not reach you, we will leave a message.     If any biopsies were taken you will be contacted by phone or by letter within the next 1-3 weeks.  Please call us  at [phone] if you have not heard about the biopsies in 3 weeks.    SIGNATURES/CONFIDENTIALITY: You and/or your care partner have signed paperwork which will be entered into your electronic medical record.  These signatures attest to the fact that that the information above on your After Visit Summary has been reviewed and is understood.  Full responsibility of the confidentiality of this discharge information lies with you and/or your care-partner.

## 2023-11-05 ENCOUNTER — Telehealth: Payer: Self-pay | Admitting: *Deleted

## 2023-11-05 NOTE — Telephone Encounter (Signed)
  Follow up Call-     11/04/2023    3:17 PM  Call back number  Post procedure Call Back phone  # [phone]  Permission to leave phone message Yes     Patient questions:   Message to call if necessary.

## 2023-11-07 LAB — SURGICAL PATHOLOGY

## 2023-11-09 ENCOUNTER — Ambulatory Visit: Payer: Self-pay | Admitting: Gastroenterology

## 2023-11-17 DIAGNOSIS — F410 Panic disorder [episodic paroxysmal anxiety] without agoraphobia: Secondary | ICD-10-CM | POA: Diagnosis not present

## 2023-11-17 DIAGNOSIS — F411 Generalized anxiety disorder: Secondary | ICD-10-CM | POA: Diagnosis not present

## 2023-11-18 ENCOUNTER — Other Ambulatory Visit: Payer: Self-pay | Admitting: Allergy

## 2023-11-18 DIAGNOSIS — I10 Essential (primary) hypertension: Secondary | ICD-10-CM | POA: Diagnosis not present

## 2023-11-20 ENCOUNTER — Encounter

## 2023-11-20 ENCOUNTER — Telehealth: Admitting: Physician Assistant

## 2023-11-20 DIAGNOSIS — K121 Other forms of stomatitis: Secondary | ICD-10-CM | POA: Diagnosis not present

## 2023-11-20 MED ORDER — CHLORHEXIDINE GLUCONATE 0.12 % MT SOLN: 15.0000 mL | Freq: Two times a day (BID) | OROMUCOSAL | 0 refills | Status: AC

## 2023-11-20 MED ORDER — TRIAMCINOLONE ACETONIDE 0.1 % MT PSTE: 1.0000 | PASTE | Freq: Two times a day (BID) | OROMUCOSAL | 12 refills | Status: AC

## 2023-11-20 NOTE — Progress Notes (Signed)
 Message sent to patient requesting further input regarding current symptoms. Awaiting patient response.

## 2023-11-20 NOTE — Progress Notes (Signed)
 I have spent 5 minutes in review of e-visit questionnaire, review and updating patient chart, medical decision making and response to patient.   Elsie Velma Lunger, PA-C

## 2023-11-20 NOTE — Progress Notes (Signed)
 E-Visit for Mouth Ulcers  We are sorry that you are not feeling well.  Here is how we plan to help!  Based on what you have shared with me, it appears that you do have mouth ulcer(s) from trauma to the area.     The following medications should decrease the discomfort and help with healing. Chlorhexidine mouthwash daily for 3 days and Triamcinolone  Dental Paste apply 3 times daily as needed for up to 3 days   Mouth ulcers are painful areas in the mouth and gums. These are also known as "canker sores".  They can occur anywhere inside the mouth. While mostly harmless, mouth ulcers can be extremely uncomfortable and may make it difficult to eat, drink, and brush your teeth.  You may have more than 1 ulcer and they can vary and change in size. Mouth ulcers are not contagious and should not be confused with cold sores.  Cold sores appear on the lip or around the outside of the mouth and often begin with a tingling, burning or itching sensation.   While the exact causes are unknown, some common causes and factors that may aggravate mouth ulcers include: Genetics - Sometimes mouth ulcers run in families High alcohol  intake Acidic foods such as citrus fruits like pineapple, grapefruit, orange fruits/juices, may aggravate mouth ulcers Other foods high in acidity or spice such as coffee, chocolate, chips, pretzels, eggs, nuts, cheese Quitting smoking Injury caused by biting the tongue or inside of the cheek Diet lacking in B-12, zinc, folic acid or iron Female hormone shifts with menstruation Excessive fatigue, emotional stress or anxiety Prevention: Talk to your doctor if you are taking meds that are known to cause mouth ulcers such as:   Anti-inflammatory drugs (for example Ibuprofen , Naproxen  sodium), pain killers, Beta blockers, Oral nicotine  replacement drugs, Some street drugs (heroin).   Avoid allowing any tablets to dissolve in your mouth that are meant to swallowed whole Avoid foods/drinks  that trigger or worsen symptoms Keep your mouth clean with daily brushing and flossing  Home Care: The goal with treatment is to ease the pain where ulcers occur and help them heal as quickly as possible.  There is no medical treatment to prevent mouth ulcers from coming back or recurring.  Avoid spicy and acidic foods Eat soft foods and avoid rough, crunchy foods Avoid chewing gum Do not use toothpaste that contains sodium lauryl sulphite Use a straw to drink which helps avoid liquids toughing the ulcers near the front of your mouth Use a very soft toothbrush If you have dentures or dental hardware that you feel is not fitting well or contributing to his, please see your dentist. Use saltwater mouthwash which helps healing. Dissolve a  teaspoon of salt in a glass of warm water. Swish around your mouth and spit it out. This can be used as needed if it is soothing.   GET HELP RIGHT AWAY IF: Persistent ulcers require checking IN PERSON (face to face). Any mouth lesion lasting longer than a month should be seen by your DENTIST as soon as possible for evaluation for possible oral cancer. If you have a non-painful ulcer in 1 or more areas of your mouth Ulcers that are spreading, are very large or particularly painful Ulcers last longer than one week without improving on treatment If you develop a fever, swollen glands and begin to feel unwell Ulcers that developed after starting a new medication MAKE SURE YOU: Understand these instructions. Will watch your condition. Will get  help right away if you are not doing well or get worse.  Thank you for choosing an e-visit.  Your e-visit answers were reviewed by a board certified advanced clinical practitioner to complete your personal care plan. Depending upon the condition, your plan could have included both over the counter or prescription medications.  Please review your pharmacy choice. Make sure the pharmacy is open so you can pick up  prescription now. If there is a problem, you may contact your provider through [employer] and have the prescription routed to another pharmacy.  Your safety is important to us . If you have drug allergies check your prescription carefully.   For the next 24 hours you can use MyChart to ask questions about today's visit, request a non-urgent call back, or ask for a work or school excuse. You will get an email in the next two days asking about your experience. I hope that your e-visit has been valuable and will speed your recovery.

## 2023-11-28 ENCOUNTER — Other Ambulatory Visit: Payer: Self-pay | Admitting: Allergy and Immunology

## 2023-11-28 ENCOUNTER — Telehealth: Admitting: Physician Assistant

## 2023-11-28 DIAGNOSIS — B9789 Other viral agents as the cause of diseases classified elsewhere: Secondary | ICD-10-CM | POA: Diagnosis not present

## 2023-11-28 DIAGNOSIS — J019 Acute sinusitis, unspecified: Secondary | ICD-10-CM

## 2023-11-28 DIAGNOSIS — H6992 Unspecified Eustachian tube disorder, left ear: Secondary | ICD-10-CM

## 2023-11-28 MED ORDER — FLUTICASONE PROPIONATE 50 MCG/ACT NA SUSP: 2.0000 | Freq: Every day | NASAL | 0 refills | Status: AC

## 2023-11-28 MED ORDER — BENZONATATE 100 MG PO CAPS: 100.0000 mg | ORAL_CAPSULE | Freq: Three times a day (TID) | ORAL | 0 refills | Status: AC | PRN

## 2023-11-28 NOTE — Addendum Note (Signed)
 Addended by: VIVIENNE DELON HERO on: 11/28/2023 09:48 AM   Modules accepted: Orders

## 2023-11-28 NOTE — Progress Notes (Signed)

## 2023-12-02 DIAGNOSIS — F411 Generalized anxiety disorder: Secondary | ICD-10-CM | POA: Diagnosis not present

## 2023-12-02 DIAGNOSIS — F410 Panic disorder [episodic paroxysmal anxiety] without agoraphobia: Secondary | ICD-10-CM | POA: Diagnosis not present

## 2023-12-02 DIAGNOSIS — F902 Attention-deficit hyperactivity disorder, combined type: Secondary | ICD-10-CM | POA: Diagnosis not present

## 2023-12-08 ENCOUNTER — Telehealth: Admitting: Physician Assistant

## 2023-12-08 DIAGNOSIS — B001 Herpesviral vesicular dermatitis: Secondary | ICD-10-CM

## 2023-12-08 MED ORDER — VALACYCLOVIR HCL 1 G PO TABS: 2000.0000 mg | ORAL_TABLET | Freq: Two times a day (BID) | ORAL | 0 refills | Status: AC

## 2023-12-08 NOTE — Progress Notes (Signed)
We are sorry that you are not feeling well.  Here is how we plan to help!  Based on what you have shared with me it does look like you have a viral infection.    Most cold sores or fever blisters are small fluid filled blisters around the mouth caused by herpes simplex virus.  The most common strain of the virus causing cold sores is herpes simplex virus 1.  It can be spread by skin contact, sharing eating utensils, or even sharing towels.  Cold sores are contagious to other people until dry. (Approximately 5-7 days).  Wash your hands. You can spread the virus to your eyes through handling your contact lenses after touching the lesions.  Most people experience pain at the sight or tingling sensations in their lips that may begin before the ulcers erupt.  Herpes simplex is treatable but not curable.  It may lie dormant for a long time and then reappear due to stress or prolonged sun exposure.  Many patients have success in treating their cold sores with an over the counter topical called Abreva.  You may apply the cream up to 5 times daily (maximum 10 days) until healing occurs.  If you would like to use an oral antiviral medication to speed the healing of your cold sore, I have sent a prescription to your local pharmacy Valacyclovir 2 gm take one by mouth twice a day for 1 day    HOME CARE:  Wash your hands frequently. Do not pick at or rub the sore. Don't open the blisters. Avoid kissing other people during this time. Avoid sharing drinking glasses, eating utensils, or razors. Do not handle contact lenses unless you have thoroughly washed your hands with soap and warm water! Avoid oral sex during this time.  Herpes from sores on your mouth can spread to your partner's genital area. Avoid contact with anyone who has eczema or a weakened immune system. Cold sores are often triggered by exposure to intense sunlight, use a lip balm containing a sunscreen (SPF 30 or higher).  GET HELP RIGHT AWAY  IF:  Blisters look infected. Blisters occur near or in the eye. Symptoms last longer than 10 days. Your symptoms become worse.  MAKE SURE YOU:  Understand these instructions. Will watch your condition. Will get help right away if you are not doing well or get worse.    Your e-visit answers were reviewed by a board certified advanced clinical practitioner to complete your personal care plan.  Depending upon the condition, your plan could have  Included both over the counter or prescription medications.    Please review your pharmacy choice.  Be sure that the pharmacy you have chosen is open so that you can pick up your prescription now.  If there is a problem you can message your provider in MyChart to have the prescription routed to another pharmacy.    Your safety is important to [place].  If you have drug allergies check our prescription carefully.  For the next 24 hours you can use MyChart to ask questions about today's visit, request a non-urgent call back, or ask for a work or school excuse from your e-visit provider.  You will get an email in the next two days asking about your experience.  I hope that your e-visit has been valuable and will speed your recovery.  I have spent 5 minutes in review of e-visit questionnaire, review and updating patient chart, medical decision making and response to patient.  Margaretann Loveless, PA-C

## 2023-12-11 ENCOUNTER — Telehealth: Admitting: Family Medicine

## 2023-12-11 DIAGNOSIS — L089 Local infection of the skin and subcutaneous tissue, unspecified: Secondary | ICD-10-CM

## 2023-12-11 MED ORDER — CLINDAMYCIN HCL 300 MG PO CAPS: 300.0000 mg | ORAL_CAPSULE | Freq: Three times a day (TID) | ORAL | 0 refills | Status: AC

## 2023-12-11 NOTE — Progress Notes (Signed)

## 2023-12-16 ENCOUNTER — Telehealth

## 2023-12-16 DIAGNOSIS — R630 Anorexia: Secondary | ICD-10-CM

## 2023-12-16 DIAGNOSIS — M546 Pain in thoracic spine: Secondary | ICD-10-CM

## 2023-12-16 DIAGNOSIS — R5383 Other fatigue: Secondary | ICD-10-CM

## 2023-12-17 NOTE — Progress Notes (Signed)
  Because you are having back pain with fatigue and a decreased appetite, I feel your condition warrants further evaluation and I recommend that you be seen in a face-to-face visit.   NOTE: There will be NO CHARGE for this E-Visit   If you are having a true medical emergency, please call 911.     For an urgent face to face visit, [place] has multiple urgent care centers for your convenience.  Click the link below for the full list of locations and hours, walk-in wait times, appointment scheduling options and driving directions:  Urgent Care - [place], [place], [place], [place], [employer], [place]  [place]     Your MyChart E-visit questionnaire answers were reviewed by a board certified advanced clinical practitioner to complete your personal care plan based on your specific symptoms.    Thank you for using e-Visits.

## 2023-12-19 DIAGNOSIS — I10 Essential (primary) hypertension: Secondary | ICD-10-CM | POA: Diagnosis not present

## 2023-12-22 DIAGNOSIS — R051 Acute cough: Secondary | ICD-10-CM | POA: Diagnosis not present

## 2023-12-22 DIAGNOSIS — R519 Headache, unspecified: Secondary | ICD-10-CM | POA: Diagnosis not present

## 2023-12-22 DIAGNOSIS — J020 Streptococcal pharyngitis: Secondary | ICD-10-CM | POA: Diagnosis not present

## 2023-12-23 DIAGNOSIS — F902 Attention-deficit hyperactivity disorder, combined type: Secondary | ICD-10-CM | POA: Diagnosis not present

## 2023-12-23 DIAGNOSIS — F410 Panic disorder [episodic paroxysmal anxiety] without agoraphobia: Secondary | ICD-10-CM | POA: Diagnosis not present

## 2023-12-23 DIAGNOSIS — F4312 Post-traumatic stress disorder, chronic: Secondary | ICD-10-CM | POA: Diagnosis not present

## 2023-12-24 ENCOUNTER — Other Ambulatory Visit: Payer: Self-pay | Admitting: Medical Genetics

## 2023-12-24 DIAGNOSIS — Z006 Encounter for examination for normal comparison and control in clinical research program: Secondary | ICD-10-CM

## 2024-01-07 DIAGNOSIS — I10 Essential (primary) hypertension: Secondary | ICD-10-CM | POA: Insufficient documentation

## 2024-01-07 DIAGNOSIS — K5792 Diverticulitis of intestine, part unspecified, without perforation or abscess without bleeding: Secondary | ICD-10-CM | POA: Diagnosis present

## 2024-01-07 DIAGNOSIS — Z880 Allergy status to penicillin: Secondary | ICD-10-CM | POA: Diagnosis not present

## 2024-01-07 DIAGNOSIS — Z91040 Latex allergy status: Secondary | ICD-10-CM | POA: Diagnosis not present

## 2024-01-07 DIAGNOSIS — F411 Generalized anxiety disorder: Secondary | ICD-10-CM | POA: Diagnosis present

## 2024-01-07 DIAGNOSIS — Z888 Allergy status to other drugs, medicaments and biological substances status: Secondary | ICD-10-CM | POA: Diagnosis not present

## 2024-01-07 DIAGNOSIS — Z881 Allergy status to other antibiotic agents status: Secondary | ICD-10-CM | POA: Diagnosis not present

## 2024-01-07 DIAGNOSIS — F988 Other specified behavioral and emotional disorders with onset usually occurring in childhood and adolescence: Secondary | ICD-10-CM | POA: Diagnosis present

## 2024-01-07 DIAGNOSIS — Z882 Allergy status to sulfonamides status: Secondary | ICD-10-CM | POA: Diagnosis not present

## 2024-01-07 DIAGNOSIS — Z79899 Other long term (current) drug therapy: Secondary | ICD-10-CM | POA: Diagnosis not present

## 2024-01-07 DIAGNOSIS — Z90710 Acquired absence of both cervix and uterus: Secondary | ICD-10-CM | POA: Diagnosis not present

## 2024-01-07 DIAGNOSIS — A419 Sepsis, unspecified organism: Secondary | ICD-10-CM | POA: Diagnosis present

## 2024-01-07 DIAGNOSIS — K5732 Diverticulitis of large intestine without perforation or abscess without bleeding: Secondary | ICD-10-CM | POA: Diagnosis not present

## 2024-01-07 DIAGNOSIS — D72829 Elevated white blood cell count, unspecified: Secondary | ICD-10-CM | POA: Diagnosis present

## 2024-01-07 DIAGNOSIS — E860 Dehydration: Secondary | ICD-10-CM | POA: Diagnosis present

## 2024-01-07 DIAGNOSIS — K219 Gastro-esophageal reflux disease without esophagitis: Secondary | ICD-10-CM | POA: Insufficient documentation

## 2024-01-07 DIAGNOSIS — Z7409 Other reduced mobility: Secondary | ICD-10-CM | POA: Diagnosis present

## 2024-01-07 DIAGNOSIS — K5909 Other constipation: Secondary | ICD-10-CM | POA: Diagnosis present

## 2024-01-07 DIAGNOSIS — R000 Tachycardia, unspecified: Secondary | ICD-10-CM | POA: Diagnosis present

## 2024-01-07 DIAGNOSIS — F329 Major depressive disorder, single episode, unspecified: Secondary | ICD-10-CM | POA: Diagnosis present

## 2024-01-07 DIAGNOSIS — E785 Hyperlipidemia, unspecified: Secondary | ICD-10-CM | POA: Diagnosis present

## 2024-01-10 ENCOUNTER — Encounter: Payer: Self-pay | Admitting: Gastroenterology

## 2024-01-12 ENCOUNTER — Telehealth: Payer: Self-pay | Admitting: Gastroenterology

## 2024-01-12 NOTE — Telephone Encounter (Addendum)
 Pt stated that she was in the hospital in [place] for Diverticulitis on November 19th -Nov 22. Pt questioned if she should continue taking her Acid reflux medications while taking the antibiotics that were prescribed . Pt was notified to continue to take her GERD medication as prescribed.  Pt verbalized understanding with all questions answered.

## 2024-01-12 NOTE — Telephone Encounter (Signed)
 Inbound call from patient stating she was admitted to [facility] the 19th for diverticulitis with micro perforation and was put on antibiotics but would like to speak to nurse in regards to what medication she can continue taking  Requesting a call back  Please advise  Thank you

## 2024-01-18 DIAGNOSIS — I10 Essential (primary) hypertension: Secondary | ICD-10-CM | POA: Diagnosis not present

## 2024-01-19 DIAGNOSIS — K5792 Diverticulitis of intestine, part unspecified, without perforation or abscess without bleeding: Secondary | ICD-10-CM | POA: Diagnosis not present

## 2024-01-20 DIAGNOSIS — K5792 Diverticulitis of intestine, part unspecified, without perforation or abscess without bleeding: Secondary | ICD-10-CM | POA: Diagnosis not present

## 2024-01-20 DIAGNOSIS — N200 Calculus of kidney: Secondary | ICD-10-CM | POA: Diagnosis not present

## 2024-01-20 DIAGNOSIS — K5730 Diverticulosis of large intestine without perforation or abscess without bleeding: Secondary | ICD-10-CM | POA: Diagnosis not present

## 2024-01-21 DIAGNOSIS — Z6822 Body mass index (BMI) 22.0-22.9, adult: Secondary | ICD-10-CM | POA: Diagnosis not present

## 2024-01-21 DIAGNOSIS — K5732 Diverticulitis of large intestine without perforation or abscess without bleeding: Secondary | ICD-10-CM | POA: Diagnosis not present

## 2024-01-21 DIAGNOSIS — Z789 Other specified health status: Secondary | ICD-10-CM | POA: Diagnosis not present

## 2024-01-21 DIAGNOSIS — N200 Calculus of kidney: Secondary | ICD-10-CM | POA: Diagnosis not present

## 2024-01-21 DIAGNOSIS — I10 Essential (primary) hypertension: Secondary | ICD-10-CM | POA: Diagnosis not present

## 2024-01-22 ENCOUNTER — Other Ambulatory Visit (HOSPITAL_BASED_OUTPATIENT_CLINIC_OR_DEPARTMENT_OTHER): Payer: Self-pay | Admitting: Family Medicine

## 2024-01-22 DIAGNOSIS — Z1231 Encounter for screening mammogram for malignant neoplasm of breast: Secondary | ICD-10-CM

## 2024-01-26 DIAGNOSIS — K5792 Diverticulitis of intestine, part unspecified, without perforation or abscess without bleeding: Secondary | ICD-10-CM | POA: Diagnosis not present

## 2024-01-28 ENCOUNTER — Telehealth: Payer: Self-pay | Admitting: Gastroenterology

## 2024-01-28 NOTE — Telephone Encounter (Signed)
 Patient states surgery date is 1/14. Please advise, thank you

## 2024-01-28 NOTE — Telephone Encounter (Signed)
 Inbound call from patient stating that she is needing to have surgery to remove part of her colon with [facility] with Dr. Kingston and he is wanting patient to have a colonoscopy the day before her procedure in which she does not have a date for but it will be sometime in january. Patient is requesting a call to further discuss recommendations from Dr. Charlanne. Please advise.

## 2024-01-28 NOTE — Telephone Encounter (Signed)
Please see notes below and advise 

## 2024-01-28 NOTE — Telephone Encounter (Signed)
 Pt stated that her surgeon requested that she have a colonoscopy prior to her surgery. Pt stated that she does not have a surgery date. Pt was scheduled to see Dr. Charlanne for an office visit on 02/23/2024 at 8:50 AM . Pt made aware.   Pt notified to call back if surgery date is soon and we can seek Dr. Charlanne recommendations on the colonoscopy prior.  Pt verbalized understanding with all questions answered.

## 2024-01-29 NOTE — Telephone Encounter (Signed)
 Left message for patient to call back

## 2024-01-29 NOTE — Telephone Encounter (Signed)
 Will be very hard to coordinate to get colonoscopy done just prior to surgery Lets see her in the office and then figure out RG

## 2024-01-29 NOTE — Telephone Encounter (Signed)
 Spoke with patient & she has asked that I cancel her OV in January since she has found an office that will do the colon a day prior to the surgery, and she will have her records faxed to us  next year.

## 2024-02-03 DIAGNOSIS — F331 Major depressive disorder, recurrent, moderate: Secondary | ICD-10-CM | POA: Diagnosis not present

## 2024-02-03 DIAGNOSIS — F902 Attention-deficit hyperactivity disorder, combined type: Secondary | ICD-10-CM | POA: Diagnosis not present

## 2024-02-03 DIAGNOSIS — F411 Generalized anxiety disorder: Secondary | ICD-10-CM | POA: Diagnosis not present

## 2024-02-16 ENCOUNTER — Telehealth: Admitting: Emergency Medicine

## 2024-02-16 DIAGNOSIS — B353 Tinea pedis: Secondary | ICD-10-CM | POA: Diagnosis not present

## 2024-02-16 NOTE — Progress Notes (Signed)
"           E-Visit for Athlete's Foot  We are sorry that you are not feeling well. Here is how we plan to help!  Based on what you shared with me, it looks like you have tinea pedis, or Athletes Foot.  This type of rash is caused by a fungus and can be spread through shared towels, clothing, bedding, etc., as well as hard surfaces (particularly in moist areas) such as shower stalls, locker room floors, pool areas, etc.  The symptoms of athletes foot include redness, swelling, itching, peeling and flaking of the skin between the toes.  The sole and heel of the foot may also be affected. In severe cases, the skin on the feet can blister.   Athletes foot can usually be treated with over-the-counter topical antifungal products. Prescription medications are only indicated for an extensive rash or if over the counter treatments have failed.   I am recommending the following over-the-counter treatments:Clotrimazole 1% cream or gel, apply to area twice per day for four weeks  It can take up to a month to heal athlete's foot.   HOME CARE:  Keep your feet clean, dry, and cool. Avoid using swimming pools, public showers, or foot baths. Wear sandals when possible or help air your shoes out by alternating them every 2-3 days. Avoid wearing closed shoes and wearing socks made from fabric that doesnt dry easily (for example, nylon). Treat the infection with recommended medication.  GET HELP RIGHT AWAY IF:  Symptoms that dont go away after treatment. Severe itching that persists. If your rash spreads or swells. If your rash begins to have drainage or smell. You develop a fever.  MAKE SURE YOU   Understand these instructions. Will continue to monitor your condition for changes. Will get help right away if you are not doing well or get worse.   Thank you for choosing an e-visit.   Your e-visit answers were reviewed by a board certified advanced clinical practitioner to complete your personal  care plan. Depending upon the condition, your plan could have included both over the counter or prescription medications.   Please review your pharmacy choice. Make sure the pharmacy is open so you can pick up the prescription now. If there is a problem, you may contact your provider through [employer] and have the prescription routed to another pharmacy.   Your safety is important to us . If you have drug allergies, check your prescription carefully.    For the next 24 hours you can use MyChart to ask questions about todays visit, request a non-urgent call back, or ask for a work or school excuse.   You will receive an email in the next two days asking about your experience. I hope that your e-visit has been valuable and will speed your recovery   References or for more information:  fatmenus.com.au?search=athletes%[email or web address]%[email or web address]&source=search_result&selectedTitle=1~104&usage_type=default&display_rank=1  [employer]exp[employer].com.ee  I have spent 5 minutes in review of e-visit questionnaire, review and updating patient chart, medical decision making and response to patient.   Jon Belt, PhD, FNP-BC             "

## 2024-02-23 ENCOUNTER — Ambulatory Visit: Admitting: Gastroenterology
